# Patient Record
Sex: Male | Born: 1970 | Race: White | Hispanic: No | Marital: Married | Smoking: Current every day smoker
Health system: Southern US, Community
[De-identification: ages and names within clinical notes are randomized; demographics above are authoritative.]

## PROBLEM LIST (undated history)

## (undated) DIAGNOSIS — T8859XA Other complications of anesthesia, initial encounter: Secondary | ICD-10-CM

## (undated) DIAGNOSIS — K219 Gastro-esophageal reflux disease without esophagitis: Secondary | ICD-10-CM

## (undated) DIAGNOSIS — G4730 Sleep apnea, unspecified: Secondary | ICD-10-CM

## (undated) HISTORY — DX: Gastro-esophageal reflux disease without esophagitis: K21.9

## (undated) HISTORY — PX: HAND SURGERY: SHX662

## (undated) HISTORY — PX: PROSTATE SURGERY: SHX751

## (undated) HISTORY — PX: CHOLECYSTECTOMY: SHX55

---

## 2009-11-26 HISTORY — PX: COLONOSCOPY: SHX174

## 2010-03-13 ENCOUNTER — Ambulatory Visit: Payer: Self-pay | Admitting: Gastroenterology

## 2010-03-13 DIAGNOSIS — K219 Gastro-esophageal reflux disease without esophagitis: Secondary | ICD-10-CM

## 2010-03-13 DIAGNOSIS — R1310 Dysphagia, unspecified: Secondary | ICD-10-CM | POA: Insufficient documentation

## 2010-03-13 DIAGNOSIS — K644 Residual hemorrhoidal skin tags: Secondary | ICD-10-CM | POA: Insufficient documentation

## 2010-03-13 DIAGNOSIS — K625 Hemorrhage of anus and rectum: Secondary | ICD-10-CM

## 2010-03-13 DIAGNOSIS — R1013 Epigastric pain: Secondary | ICD-10-CM

## 2010-03-14 ENCOUNTER — Encounter: Payer: Self-pay | Admitting: Gastroenterology

## 2010-03-27 ENCOUNTER — Ambulatory Visit: Payer: Self-pay | Admitting: Gastroenterology

## 2010-03-27 ENCOUNTER — Ambulatory Visit (HOSPITAL_COMMUNITY): Admission: RE | Admit: 2010-03-27 | Discharge: 2010-03-27 | Payer: Self-pay | Admitting: Gastroenterology

## 2010-06-01 ENCOUNTER — Encounter: Payer: Self-pay | Admitting: Gastroenterology

## 2010-12-26 NOTE — Assessment & Plan Note (Signed)
Summary: NPP,GERD,HEMORRHOIDS.GU   Visit Type:  Consult Referring Provider:  Forest Gleason Primary Care Provider:  [facility]  Chief Complaint:  reflux and hemorroid.  History of Present Illness: [name] is a pleasant 40 y/o WM, patient of Dr. Forest Gleason, who presents today for further evaluation of GERD, epigastric tenderness, and hemorrhoids. He has had GERD for several years. States it was diagnosed by ENT and he was started on Zegerid. He has taken Zegerid OTC intermittently but more recently has taken more regularly. He complains of epigastric tenderness more over the last six months. He has hoarseness but believes this is due to strain at time of auctioneering. He has intermittent heartburn. Occasionally has dysphagia to solid foods. Symptoms worse with stress. He takes Zegerid mostly when he has increased belching. All symptoms worse with auctioneering.   He also has chronic soft to loose stools. Usually one BM daily but up to 3-4 if increased stress. See brbpr frequently. Uses [place] Jelly. Thinks he has hemorrhoid, which he wants removed. Never had TCS. Denies melena, constipation.    Current Medications (verified): 1)  Zegerid 40-1100 Mg Caps (Omeprazole-Sodium Bicarbonate) .... As Needed  Allergies (verified): No Known Drug Allergies  Past History:  Past Medical History: GERD H/O chronic sinusitis  Past Surgical History: Left hand fracture  Family History: No FH CRC, liver, chronic GI illnesses.  Social History: Married. 2 children. Farm cows/horses. Auctioneer. 1ppd for 20 years. Drinks 24 ounces of beer in evening about 3-4 days per week. No drugs.  Review of Systems General:  Denies fever, chills, sweats, weakness, and weight loss. Eyes:  Denies vision loss. ENT:  Complains of hoarseness and difficulty swallowing; denies nasal congestion and sore throat. CV:  Denies chest pains, angina, palpitations, dyspnea on exertion, and peripheral  edema. Resp:  Denies dyspnea at rest, dyspnea with exercise, cough, and sputum. GI:  See HPI. GU:  Denies urinary burning and blood in urine. MS:  Denies joint pain / LOM. Derm:  Denies rash and itching. Neuro:  Denies weakness, frequent headaches, memory loss, and confusion. Psych:  Denies depression and anxiety. Endo:  Denies unusual weight change. Heme:  Denies bruising and bleeding. Allergy:  Denies hives and rash.  Vital Signs:  Patient profile:   40 year old male Height:      69 inches Weight:      184 pounds BMI:     27.27 Temp:     97.7 degrees F oral Pulse rate:   80 / minute BP sitting:   112 / 84  (left arm) Cuff size:   regular  Vitals Entered By: Hendricks Limes LPN (March 13, 2010 10:40 AM)  Physical Exam  General:  Well developed, well nourished, no acute distress. Head:  Normocephalic and atraumatic. Eyes:  Conjunctivae pink, no scleral icterus.  Mouth:  Oropharyngeal mucosa moist, pink.  No lesions, erythema or exudate.    Neck:  Supple; no masses or thyromegaly. Lungs:  Clear throughout to auscultation. Heart:  Regular rate and rhythm; no murmurs, rubs,  or bruits. Abdomen:  Normal BS. Tender in epigastrium as well as over the xiphoid process. Liver edge palpable at 4 fingerbredths below RCM in MCL. Smooth edge, nontender. Spleen not palpated. No masses or hernia. No abd bruit. Rectal:  At 6'oclock, pea sized skin tag noted. No erythema. Slightly tenderness DRE. No stool present. No masses in rectal vault. Secretions heme negative. Extremities:  No clubbing, cyanosis, edema or deformities noted. Neurologic:  Alert  and  oriented x4;  grossly normal neurologically. Skin:  Intact without significant lesions or rashes. Cervical Nodes:  No significant cervical adenopathy. Psych:  Alert and cooperative. Normal mood and affect.  Impression & Recommendations:  Problem # 1:  EPIGASTRIC PAIN (ICD-789.06)  Six month h/o intermittent epigastric discomfort associated  with typical reflux symptoms. Suspect secondary to GERD. Zegerid OTC has helped in past. Advised to take one daily. EGD to be performed in near future.  Risks, alternatives, benefits including but not limited to risk of reaction to medications, bleeding, infection, and perforation addressed.  Patient voiced understanding and verbal consent obtained.   He also has tenderness over the xiphoid process, likely due to inflammation. Hold on anti-inflammatories until after EGD. May benefit from two week trial of Advil.  Orders: Consultation Level III (16109)  Problem # 2:  DYSPHAGIA UNSPECIFIED (ICD-787.20)  ?esophageal ring or stricture. Given h/o chronic GERD, chronic tob/etoh use recommend EGD for further evaluation. EGD/ED to be performed in near future.  Risks, alternatives, benefits including but not limited to risk of reaction to medications, bleeding, infection, and perforation addressed.  Patient voiced understanding and verbal consent obtained.   Orders: Consultation Level III (60454)  Problem # 3:  GERD (ICD-530.81)  See #2. Take Zegeric OTC daily. If persistent symptoms, may need to take increased dose.  Orders: Consultation Level III (09811)  Problem # 4:  RECTAL BLEEDING (ICD-569.3)  Rectal bleeding, chronic. Suspect due to benign anorectal source. He has increased frequency of stools with stress, which is likely due to IBS rather than IBD or malignancy. Recommend TCS for further evaluation. Colonoscopy to be performed in near future.  Risks, alternatives, and benefits including but not limited to the risk of reaction to medication, bleeding, infection, and perforation were addressed.  Patient voiced understanding and provided verbal consent.   Orders: Consultation Level III ([id number]) Hemoccult Guaiac-1 spec.(in office) (82270)  Problem # 5:  SKIN TAG, HEMORRHOIDAL, RESIDUAL (ICD-455.9)  Will discuss with Dr. Darrick Penna. May consider banding at time of TCS. Patient aware that this may  or may not be option.  Orders: Consultation Level III (29562) I would like to thank Dr. Forest Gleason for allowing [place] to take part in the care of this nice patient.  Appended Document: NPP,GERD,HEMORRHOIDS.GU Please tell pt. If he has internal hemorhoids causing rectal bleeding we can band them. We don't band skin tags. He will need a Careers adviser.  Put pt in a 1.5 hour slot in antcipation of ETC/banding.  Appended Document: NPP,GERD,HEMORRHOIDS.GU I placed pt in a 4.5 hr slot.

## 2010-12-26 NOTE — Letter (Signed)
Summary: [facility]  [facility]   Imported By: Ave Filter 03/14/2010 14:50:00  _____________________________________________________________________  External Attachment:    Type:   Image     Comment:   External Document

## 2010-12-26 NOTE — Letter (Signed)
Summary: insurance correspondence  insurance correspondence   Imported By: Minna Merritts 06/01/2010 11:06:58  _____________________________________________________________________  External Attachment:    Type:   Image     Comment:   External Document

## 2010-12-26 NOTE — Letter (Signed)
Summary: TCS/EGD ORDER  TCS/EGD ORDER   Imported By: Ave Filter 03/13/2010 11:37:26  _____________________________________________________________________  External Attachment:    Type:   Image     Comment:   External Document

## 2017-08-06 DIAGNOSIS — H93293 Other abnormal auditory perceptions, bilateral: Secondary | ICD-10-CM | POA: Diagnosis not present

## 2017-08-06 DIAGNOSIS — H9313 Tinnitus, bilateral: Secondary | ICD-10-CM | POA: Diagnosis not present

## 2017-08-06 DIAGNOSIS — F17200 Nicotine dependence, unspecified, uncomplicated: Secondary | ICD-10-CM | POA: Diagnosis not present

## 2017-08-06 DIAGNOSIS — J342 Deviated nasal septum: Secondary | ICD-10-CM | POA: Diagnosis not present

## 2018-03-12 DIAGNOSIS — J329 Chronic sinusitis, unspecified: Secondary | ICD-10-CM | POA: Diagnosis not present

## 2018-03-12 DIAGNOSIS — J111 Influenza due to unidentified influenza virus with other respiratory manifestations: Secondary | ICD-10-CM | POA: Diagnosis not present

## 2019-06-05 ENCOUNTER — Emergency Department (HOSPITAL_COMMUNITY): Payer: BC Managed Care – PPO

## 2019-06-05 ENCOUNTER — Encounter (HOSPITAL_COMMUNITY): Payer: Self-pay | Admitting: Emergency Medicine

## 2019-06-05 ENCOUNTER — Emergency Department (HOSPITAL_COMMUNITY)
Admission: EM | Admit: 2019-06-05 | Discharge: 2019-06-05 | Disposition: A | Discharge status: Home / Self Care | Payer: BC Managed Care – PPO | Attending: Emergency Medicine | Admitting: Emergency Medicine

## 2019-06-05 ENCOUNTER — Other Ambulatory Visit: Payer: Self-pay

## 2019-06-05 DIAGNOSIS — S2232XA Fracture of one rib, left side, initial encounter for closed fracture: Secondary | ICD-10-CM

## 2019-06-05 DIAGNOSIS — W1789XA Other fall from one level to another, initial encounter: Secondary | ICD-10-CM | POA: Diagnosis not present

## 2019-06-05 DIAGNOSIS — S2231XA Fracture of one rib, right side, initial encounter for closed fracture: Secondary | ICD-10-CM | POA: Insufficient documentation

## 2019-06-05 DIAGNOSIS — Y929 Unspecified place or not applicable: Secondary | ICD-10-CM | POA: Diagnosis not present

## 2019-06-05 DIAGNOSIS — M546 Pain in thoracic spine: Secondary | ICD-10-CM | POA: Diagnosis not present

## 2019-06-05 DIAGNOSIS — R0789 Other chest pain: Secondary | ICD-10-CM | POA: Diagnosis not present

## 2019-06-05 DIAGNOSIS — Y999 Unspecified external cause status: Secondary | ICD-10-CM | POA: Insufficient documentation

## 2019-06-05 DIAGNOSIS — W19XXXA Unspecified fall, initial encounter: Secondary | ICD-10-CM

## 2019-06-05 DIAGNOSIS — Y9389 Activity, other specified: Secondary | ICD-10-CM | POA: Diagnosis not present

## 2019-06-05 DIAGNOSIS — S299XXA Unspecified injury of thorax, initial encounter: Secondary | ICD-10-CM | POA: Diagnosis not present

## 2019-06-05 DIAGNOSIS — R0602 Shortness of breath: Secondary | ICD-10-CM | POA: Diagnosis not present

## 2019-06-05 MED ORDER — HYDROCODONE-ACETAMINOPHEN 5-325 MG PO TABS: 1.0000 | ORAL_TABLET | ORAL | 0 refills | Status: DC | PRN

## 2019-06-05 MED ORDER — METHOCARBAMOL 500 MG PO TABS: 500.0000 mg | ORAL_TABLET | Freq: Every evening | ORAL | 0 refills | Status: DC | PRN

## 2019-06-05 NOTE — ED Notes (Signed)
Called x-ray to ask reason for delay-they are short staffed, patient updated.

## 2019-06-05 NOTE — Discharge Instructions (Signed)
Take pain medicine as needed for severe pain Use incentive spirometer every 2 hours to prevent pneumonia Please return if worsening

## 2019-06-05 NOTE — ED Provider Notes (Signed)
[facility] Provider Note   CSN: [id number] Arrival date & time: 06/05/19  1023     History   Chief Complaint Chief Complaint  Patient presents with  . Fall    HPI Darryl Turner is a 48 y.o. male who presents with a fall.  No significant past medical history.  Patient states that he was trying to load a truck onto a trailer and had a mechanical fall off of the trailer and fell backwards.  The incident happened 3 days ago.  He hit the back of his head and landed on his back.  He sustained a small laceration over the top of his head but states that this is healing well.  He denies headache, dizziness, nausea, vomiting, vision changes, neck pain.  He states that the wind got knocked out of him and over the past several days he has had gradually worsening pain in his mid back and over the right posterior ribs.  It hurts to take deep breaths and when he moves around.  He has been using a brace but states that the pain is getting worse and therefore he decided to come to the emergency department.  He reports associated muscle spasms of the back.  He denies any lower back pain, hip pain.  He has been ambulatory.  He is not on any blood thinners.     HPI  History reviewed. No pertinent past medical history.  Patient Active Problem List   Diagnosis Date Noted  . SKIN TAG, HEMORRHOIDAL, RESIDUAL 03/13/2010  . GERD 03/13/2010  . RECTAL BLEEDING 03/13/2010  . DYSPHAGIA UNSPECIFIED 03/13/2010  . EPIGASTRIC PAIN 03/13/2010    Past Surgical History:  Procedure Laterality Date  . HAND SURGERY Left    2001  . PROSTATE SURGERY     polyps removed        Home Medications    Prior to Admission medications   Not on File    Family History No family history on file.  Social History Social History   Tobacco Use  . Smoking status: Never Smoker  . Smokeless tobacco: Current User  Substance Use Topics  . Alcohol use: Yes    Comment: socially   . Drug use: Not  Currently     Allergies   Patient has no known allergies.   Review of Systems Review of Systems  Constitutional: Negative for fever.  Respiratory: Positive for shortness of breath.   Cardiovascular: Positive for chest pain.  Gastrointestinal: Negative for abdominal pain.  Musculoskeletal: Positive for back pain and myalgias. Negative for arthralgias.  Skin: Positive for wound.  Neurological: Negative for dizziness, syncope, weakness, numbness and headaches.  All other systems reviewed and are negative.    Physical Exam Updated Vital Signs BP (!) 154/96 (BP Location: Left Arm)   Pulse 61   Temp 98 F (36.7 C) (Oral)   Resp 20   Wt 77.1 kg   SpO2 99%   BMI 25.10 kg/m   Physical Exam Vitals signs and nursing note reviewed.  Constitutional:      General: He is not in acute distress.    Appearance: Normal appearance. He is well-developed. He is not ill-appearing.  HENT:     Head: Normocephalic and atraumatic.     Comments: Small, healed over abrasion over the right parietal area of the scalp Eyes:     General: No scleral icterus.       Right eye: No discharge.  Left eye: No discharge.     Conjunctiva/sclera: Conjunctivae normal.     Pupils: Pupils are equal, round, and reactive to light.  Neck:     Musculoskeletal: Normal range of motion.     Comments: No neck tenderness Cardiovascular:     Rate and Rhythm: Normal rate and regular rhythm.  Pulmonary:     Effort: Pulmonary effort is normal. No respiratory distress.     Breath sounds: Normal breath sounds.  Chest:     Chest wall: Tenderness (tenderness over the right lateral and posterior lower ribs) present.  Abdominal:     General: There is no distension.     Palpations: Abdomen is soft.     Tenderness: There is no abdominal tenderness.  Musculoskeletal:     Comments: Point tenderness over the mid-thoracic back and right paraspinal muscles  Skin:    General: Skin is warm and dry.  Neurological:      Mental Status: He is alert and oriented to person, place, and time.  Psychiatric:        Behavior: Behavior normal.      ED Treatments / Results  Labs (all labs ordered are listed, but only abnormal results are displayed) Labs Reviewed - No data to display  EKG None  Radiology Dg Ribs Unilateral W/chest Right  Result Date: 06/05/2019 CLINICAL DATA:  Pain following recent fall EXAM: RIGHT RIBS AND CHEST - 3+ VIEW COMPARISON:  None. FINDINGS: Frontal chest as well as oblique and cone-down rib images obtained. Lungs are clear. Heart size and pulmonary vascularity are normal. No adenopathy. There is no demonstrable rib fracture. No pneumothorax or pleural effusion. IMPRESSION: No demonstrable rib fracture.  Lungs clear. Electronically Signed   By: Bretta BangWilliam  Woodruff III M.D.   On: 06/05/2019 13:01   Dg Thoracic Spine 2 View  Result Date: 06/05/2019 CLINICAL DATA:  Pain following recent fall EXAM: THORACIC SPINE 3 VIEWS COMPARISON:  None. FINDINGS: Frontal, lateral, and swimmer's views were obtained. There is no fracture or spondylolisthesis. Disc spaces appear unremarkable. No erosive change or paraspinous lesion. Visualized lungs clear. IMPRESSION: No fracture or spondylolisthesis.  No evident arthropathy. Electronically Signed   By: Bretta BangWilliam  Woodruff III M.D.   On: 06/05/2019 13:00   Ct Chest Wo Contrast  Result Date: 06/05/2019 CLINICAL DATA:  Back and right posterior rib pain after falling off the back of a truck and landing on a trailer. EXAM: CT CHEST WITHOUT CONTRAST TECHNIQUE: Multidetector CT imaging of the chest was performed following the standard protocol without IV contrast. COMPARISON:  Chest and right rib radiographs obtained earlier today. FINDINGS: Cardiovascular: Minimal coronary artery calcification. Normal sized heart. Mediastinum/Nodes: No enlarged mediastinal or axillary lymph nodes. Multiple small, normal sized calcified left hilar lymph nodes. Thyroid gland, trachea, and  esophagus demonstrate no significant findings. Lungs/Pleura: Minimal atelectasis and small amount of probable pleural blood at the right lung base posteriorly. Small amount of bullous change in the right middle lobe medially. Clear left lung. Small superior segment left lower lobe calcified granuloma. No pneumothorax. Upper Abdomen: Unremarkable. Musculoskeletal: Essentially nondisplaced right posterior 10th rib fracture. Thoracic spine degenerative changes. IMPRESSION: 1. Essentially nondisplaced right posterior 10th rib fracture. 2. Minimal atelectasis and small amount of probable pleural blood at the right lung base posteriorly. 3. Mild right middle lobe bullous emphysema. 4. Minimal coronary artery atheromatous calcification. Emphysema (ICD10-J43.9). Electronically Signed   By: Beckie SaltsSteven  Reid M.D.   On: 06/05/2019 13:57    Procedures Procedures (including critical care time)  Medications Ordered in ED Medications - No data to display   Initial Impression / Assessment and Plan / ED Course  I have reviewed the triage vital signs and the nursing notes.  Pertinent labs & imaging results that were available during my care of the patient were reviewed by me and considered in my medical decision making (see chart for details).  48 year old male presents with a fall 3 days ago and worsening back and side pain.  He also reports a head injury however states that this is minor and he denies any headache, dizziness, vomiting.  He has no neck pain, anterior chest pain, abdominal pain.  He is markedly tender over the mid back and right posterior lateral ribs.  Will obtain chest x-ray and thoracic x-ray.  He is declining pain medicine currently.  Chest x-ray and back x-ray are negative.  Discussed with the patient and he feels very uncomfortable and is still very tender.  Will obtain CT of the chest  Chest CT is remarkable for a nondisplaced 10th posterior right rib fracture.  Discussed with patient.  He was  given prescription for pain medicine, muscle relaxer and incentive spirometer.  He is encouraged to follow-up with his doctor and return if worsening.  Final Clinical Impressions(s) / ED Diagnoses   Final diagnoses:  Closed fracture of one rib of left side, initial encounter  Fall, initial encounter    ED Discharge Orders    None       Recardo Evangelist, PA-C 06/05/19 [place], [place], DO 06/09/19 1646

## 2019-06-05 NOTE — ED Triage Notes (Signed)
Pt states on Tuesday, he fell off the back of a truck and landed on a trailer on his RT side. Pt c/o back pain and RT sided rib cage pain. Pain worsens with movement and deep inspiration.

## 2019-07-13 DIAGNOSIS — Z0000 Encounter for general adult medical examination without abnormal findings: Secondary | ICD-10-CM | POA: Diagnosis not present

## 2019-07-13 DIAGNOSIS — R1031 Right lower quadrant pain: Secondary | ICD-10-CM | POA: Diagnosis not present

## 2019-07-13 DIAGNOSIS — Z0001 Encounter for general adult medical examination with abnormal findings: Secondary | ICD-10-CM | POA: Diagnosis not present

## 2019-07-13 DIAGNOSIS — R1011 Right upper quadrant pain: Secondary | ICD-10-CM | POA: Diagnosis not present

## 2019-07-13 DIAGNOSIS — Z1389 Encounter for screening for other disorder: Secondary | ICD-10-CM | POA: Diagnosis not present

## 2019-07-13 DIAGNOSIS — Z6826 Body mass index (BMI) 26.0-26.9, adult: Secondary | ICD-10-CM | POA: Diagnosis not present

## 2020-12-04 DIAGNOSIS — J019 Acute sinusitis, unspecified: Secondary | ICD-10-CM | POA: Diagnosis not present

## 2020-12-04 DIAGNOSIS — R059 Cough, unspecified: Secondary | ICD-10-CM | POA: Diagnosis not present

## 2021-07-09 IMAGING — DX RIGHT RIBS AND CHEST - 3+ VIEW
4 series · 4 of 4 positions shown · non-contrast
Comparison: None.

CLINICAL DATA: Pain following recent fall

EXAM:
RIGHT RIBS AND CHEST - 3+ VIEW

[chest pa]
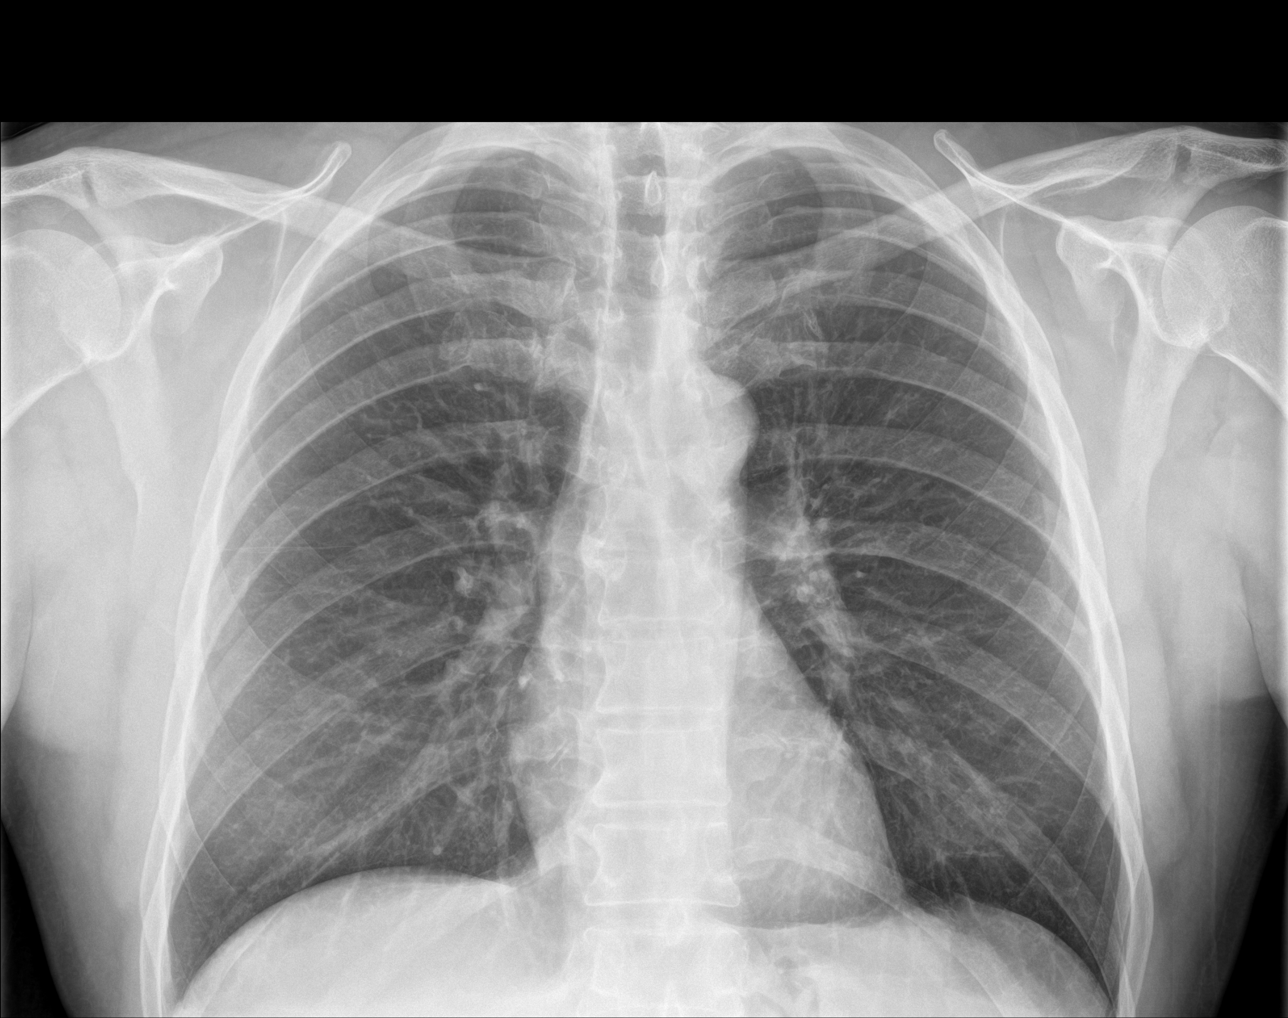

[rib pa]
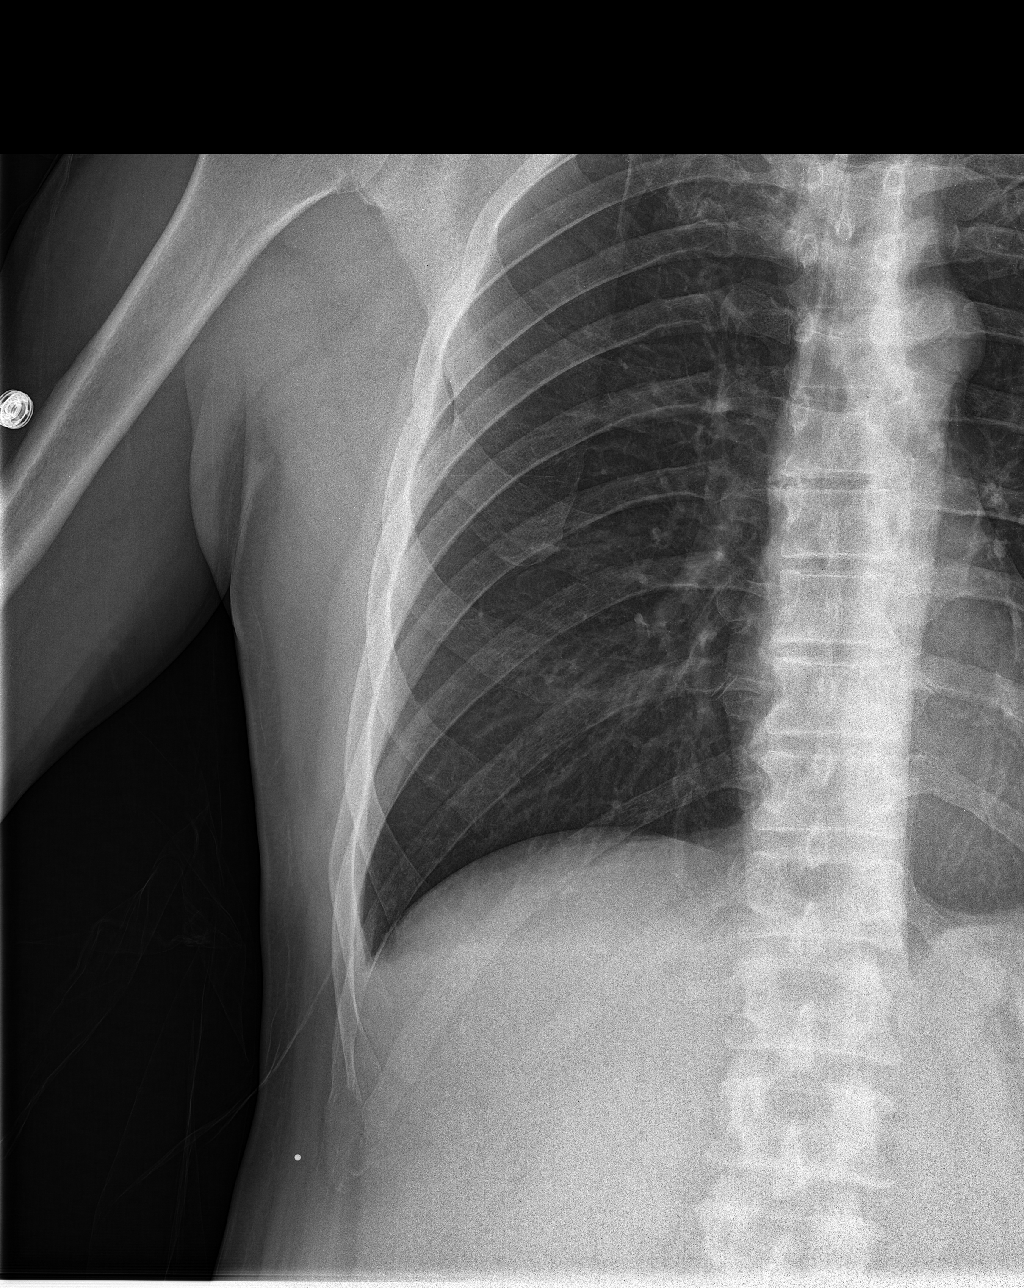

[rib obl (1 of 2)]
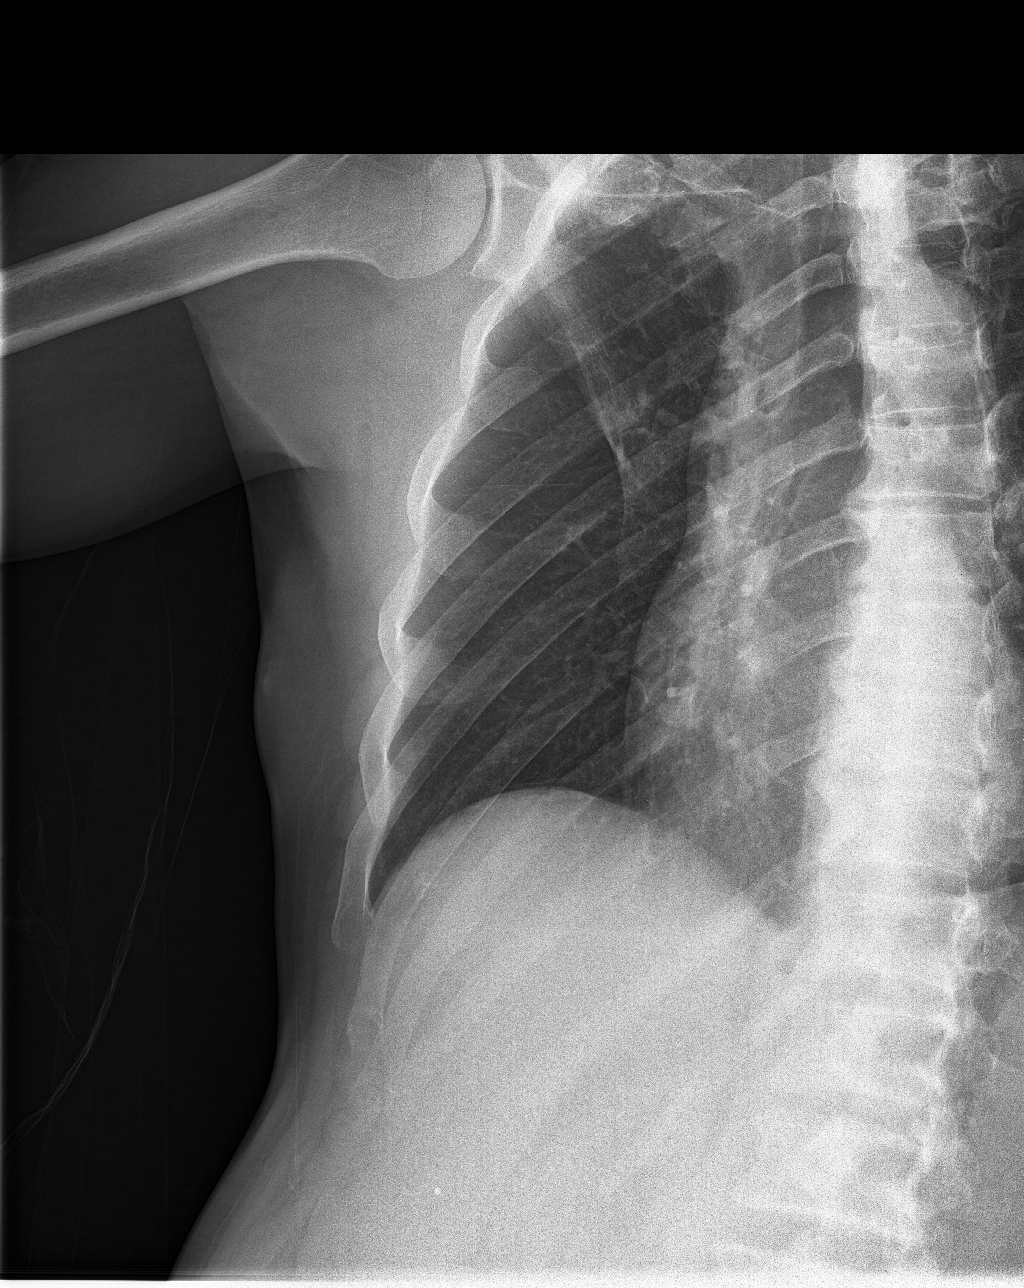

[rib obl (2 of 2)]
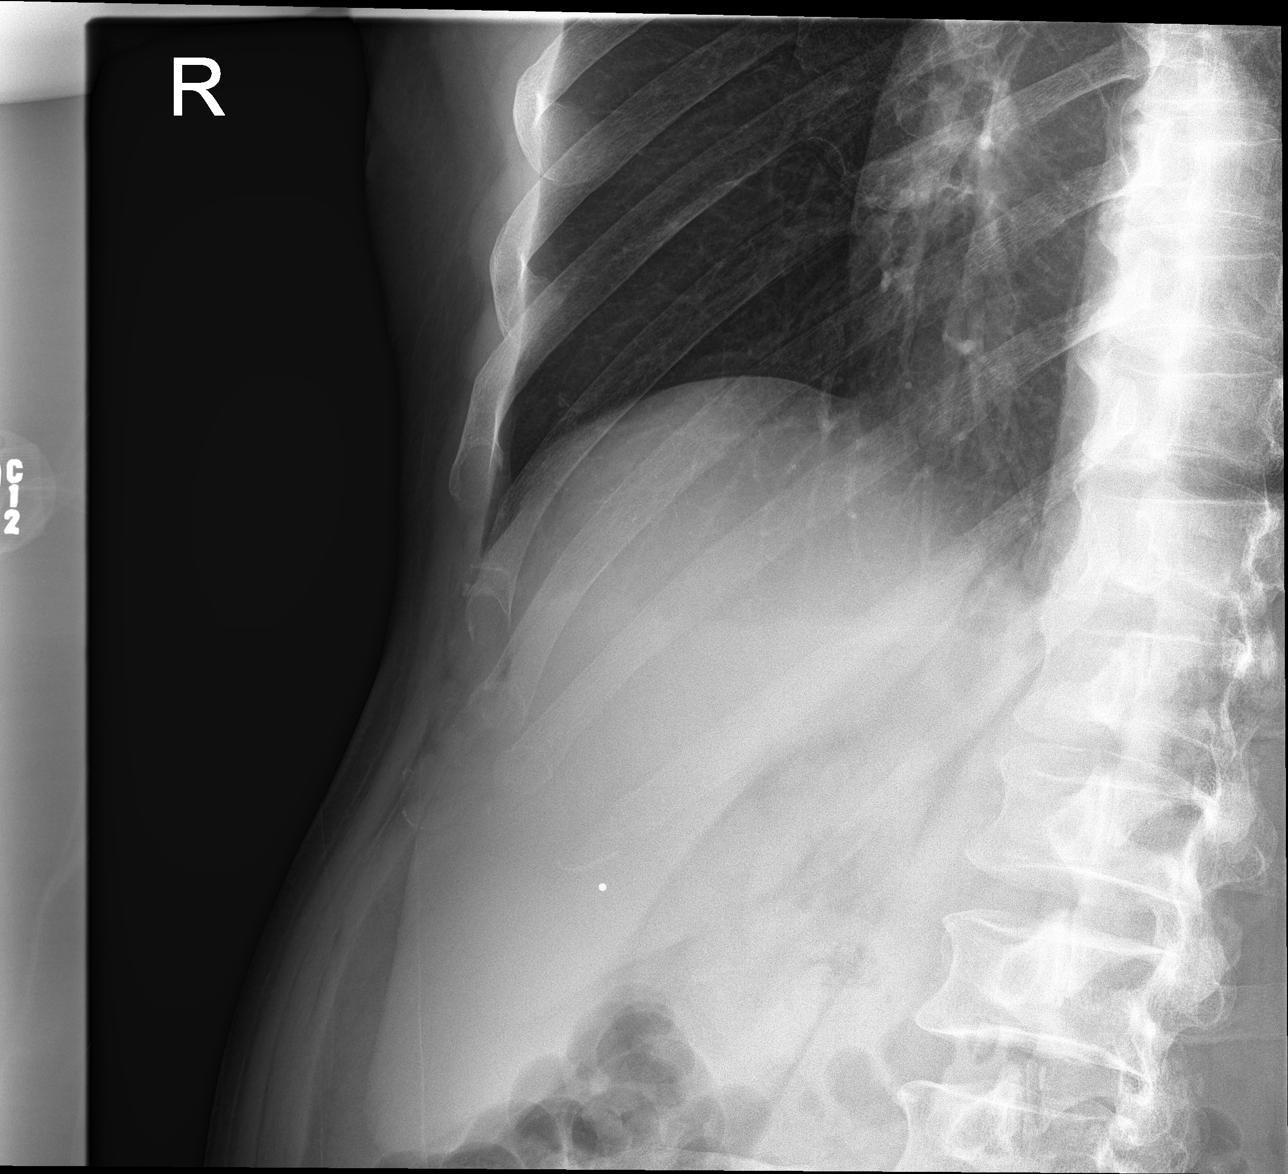

[4 of 4 positions shown; findings below may reference images not displayed]

FINDINGS: Frontal chest as well as oblique and cone-down rib images obtained.
Lungs are clear. Heart size and pulmonary vascularity are normal. No
adenopathy. There is no demonstrable rib fracture. No pneumothorax
or pleural effusion.
IMPRESSION: No demonstrable rib fracture.  Lungs clear.

## 2022-06-29 ENCOUNTER — Other Ambulatory Visit: Payer: Self-pay | Admitting: Family Medicine

## 2022-06-29 ENCOUNTER — Other Ambulatory Visit (HOSPITAL_COMMUNITY): Payer: Self-pay | Admitting: Family Medicine

## 2022-06-29 DIAGNOSIS — R1011 Right upper quadrant pain: Secondary | ICD-10-CM

## 2022-06-29 DIAGNOSIS — Z6828 Body mass index (BMI) 28.0-28.9, adult: Secondary | ICD-10-CM | POA: Diagnosis not present

## 2022-06-29 DIAGNOSIS — E663 Overweight: Secondary | ICD-10-CM | POA: Diagnosis not present

## 2022-06-29 DIAGNOSIS — Z1331 Encounter for screening for depression: Secondary | ICD-10-CM | POA: Diagnosis not present

## 2022-06-29 DIAGNOSIS — Z0000 Encounter for general adult medical examination without abnormal findings: Secondary | ICD-10-CM | POA: Diagnosis not present

## 2022-06-29 DIAGNOSIS — E785 Hyperlipidemia, unspecified: Secondary | ICD-10-CM | POA: Diagnosis not present

## 2022-07-03 ENCOUNTER — Encounter: Payer: Self-pay | Admitting: *Deleted

## 2022-07-13 ENCOUNTER — Ambulatory Visit (HOSPITAL_COMMUNITY)
Admission: RE | Admit: 2022-07-13 | Discharge: 2022-07-13 | Disposition: A | Discharge status: Home / Self Care | Payer: BC Managed Care – PPO | Source: Ambulatory Visit | Attending: Family Medicine | Admitting: Family Medicine

## 2022-07-13 DIAGNOSIS — R1011 Right upper quadrant pain: Secondary | ICD-10-CM | POA: Insufficient documentation

## 2022-07-13 DIAGNOSIS — K760 Fatty (change of) liver, not elsewhere classified: Secondary | ICD-10-CM | POA: Diagnosis not present

## 2022-08-01 ENCOUNTER — Ambulatory Visit (INDEPENDENT_AMBULATORY_CARE_PROVIDER_SITE_OTHER): Payer: BC Managed Care – PPO | Admitting: Internal Medicine

## 2022-08-01 ENCOUNTER — Encounter: Payer: Self-pay | Admitting: Internal Medicine

## 2022-08-01 VITALS — BP 129/84 | HR 71 | Temp 98.2°F | Ht 69.0 in | Wt 194.6 lb

## 2022-08-01 DIAGNOSIS — Z1211 Encounter for screening for malignant neoplasm of colon: Secondary | ICD-10-CM | POA: Diagnosis not present

## 2022-08-01 DIAGNOSIS — R194 Change in bowel habit: Secondary | ICD-10-CM

## 2022-08-01 DIAGNOSIS — R1011 Right upper quadrant pain: Secondary | ICD-10-CM | POA: Diagnosis not present

## 2022-08-01 DIAGNOSIS — K219 Gastro-esophageal reflux disease without esophagitis: Secondary | ICD-10-CM

## 2022-08-01 NOTE — H&P (View-Only) (Signed)
Primary Care Physician:  Assunta Found, MD Primary Gastroenterologist:  Dr. Marletta Lor  Chief Complaint  Patient presents with   Abdominal Pain    Abdominal pain on right side that comes and goes. Had abdominal US 07/13/22. Having ongoing loose stools.     HPI:   Darryl Turner is a 51 y.o. male who presents to clinic today by referral from his PCP Dr. Phillips Odor  with multiple GI complaints.  States for many years she has had off-and-on right upper quadrant abdominal pain.  Over the last 3 to 4 months this is progressively worsening.  Remains intermittent in nature though very severe at times.  Will "doubled him over."  Nothing seems to help.  Sometimes radiates to his back.  Some associated nausea.  Not particularly related to meals.  Does note some intermittent loose stools as well.  Occasional normal bowel movements.  States his stools are dark at times.  No frank hematochezia  Underwent ultrasound 07/13/2022 which showed a distended gallbladder as well as positive Murphy sign.  Recommended HIDA scan.  Also noted hepatic steatosis.  Patient does have chronic acid reflux which is well controlled on omeprazole 40 mg.  No dysphagia odynophagia.    Due for screening colonoscopy.  Last colonoscopy 2011 with few benign polyps removed.  No family history of colorectal malignancy.   Past Medical History:  Diagnosis Date   GERD (gastroesophageal reflux disease)     Past Surgical History:  Procedure Laterality Date   COLONOSCOPY  2011   polpys removed per patient   HAND SURGERY Left    2001    Current Outpatient Medications  Medication Sig Dispense Refill   cetirizine (ZYRTEC) 10 MG tablet Take 10 mg by mouth daily.     omeprazole-sodium bicarbonate (ZEGERID) 40-1100 MG capsule Take 1 capsule by mouth daily before breakfast.     Probiotic CAPS Take 1 capsule by mouth daily.     No current facility-administered medications for this visit.    Allergies as of 08/01/2022   (No Known  Allergies)    No family history on file.  Social History   Socioeconomic History   Marital status: Married    Spouse name: Not on file   Number of children: Not on file   Years of education: Not on file   Highest education level: Not on file  Occupational History   Not on file  Tobacco Use   Smoking status: Every Day    Types: E-cigarettes    Passive exposure: Current   Smokeless tobacco: Current  Vaping Use   Vaping Use: Some days   Substances: Nicotine  Substance and Sexual Activity   Alcohol use: Yes    Comment: socially    Drug use: Not Currently   Sexual activity: Not on file  Other Topics Concern   Not on file  Social History Narrative   Not on file   Social Determinants of Health   Financial Resource Strain: Not on file  Food Insecurity: Not on file  Transportation Needs: Not on file  Physical Activity: Not on file  Stress: Not on file  Social Connections: Not on file  Intimate Partner Violence: Not on file    Subjective: Review of Systems  Constitutional:  Negative for chills and fever.  HENT:  Negative for congestion and hearing loss.   Eyes:  Negative for blurred vision and double vision.  Respiratory:  Negative for cough and shortness of breath.   Cardiovascular:  Negative for chest  pain and palpitations.  Gastrointestinal:  Positive for abdominal pain. Negative for blood in stool, constipation, diarrhea, heartburn, melena and vomiting.  Genitourinary:  Negative for dysuria and urgency.  Musculoskeletal:  Negative for joint pain and myalgias.  Skin:  Negative for itching and rash.  Neurological:  Negative for dizziness and headaches.  Psychiatric/Behavioral:  Negative for depression. The patient is not nervous/anxious.        Objective: BP 129/84 (BP Location: Left Arm, Patient Position: Sitting, Cuff Size: Large)   Pulse 71   Temp 98.2 F (36.8 C) (Oral)   Ht 5\' 9"  (1.753 m)   Wt 194 lb 9.6 oz (88.3 kg)   BMI 28.74 kg/m  Physical  Exam Constitutional:      Appearance: Normal appearance.  HENT:     Head: Normocephalic and atraumatic.  Eyes:     Extraocular Movements: Extraocular movements intact.     Conjunctiva/sclera: Conjunctivae normal.  Cardiovascular:     Rate and Rhythm: Normal rate and regular rhythm.  Pulmonary:     Effort: Pulmonary effort is normal.     Breath sounds: Normal breath sounds.  Abdominal:     General: Bowel sounds are normal.     Palpations: Abdomen is soft.  Musculoskeletal:        General: Normal range of motion.     Cervical back: Normal range of motion and neck supple.  Skin:    General: Skin is warm.  Neurological:     General: No focal deficit present.     Mental Status: He is alert and oriented to person, place, and time.  Psychiatric:        Mood and Affect: Mood normal.        Behavior: Behavior normal.      Assessment: *Right upper quadrant pain-progressively worsening, intermittent, severe *Chronic GERD-well-controlled omeprazole *Colon cancer screening  Plan: We will order HIDA scan today to further evaluate right upper quadrant abdominal pain.  Will schedule for screening colonoscopy.The risks including infection, bleed, or perforation as well as benefits, limitations, alternatives and imponderables have been reviewed with the patient. Questions have been answered. All parties agreeable.  GERD well-controlled on omeprazole.  We will continue.  Thank you Dr. for the kind referral.  08/01/2022 1:59 PM   Disclaimer: This note was dictated with voice recognition software. Similar sounding words can inadvertently be transcribed and may not be corrected upon review.

## 2022-08-01 NOTE — Progress Notes (Signed)
  Primary Care Physician:  Golding, John, MD Primary Gastroenterologist:  Dr. Laquinton Bihm  Chief Complaint  Patient presents with   Abdominal Pain    Abdominal pain on right side that comes and goes. Had abdominal us 07/13/22. Having ongoing loose stools.     HPI:   Darryl Turner is a 51 y.o. male who presents to clinic today by referral from his PCP Dr. Golding  with multiple GI complaints.  States for many years she has had off-and-on right upper quadrant abdominal pain.  Over the last 3 to 4 months this is progressively worsening.  Remains intermittent in nature though very severe at times.  Will "doubled him over."  Nothing seems to help.  Sometimes radiates to his back.  Some associated nausea.  Not particularly related to meals.  Does note some intermittent loose stools as well.  Occasional normal bowel movements.  States his stools are dark at times.  No frank hematochezia  Underwent ultrasound 07/13/2022 which showed a distended gallbladder as well as positive Murphy sign.  Recommended HIDA scan.  Also noted hepatic steatosis.  Patient does have chronic acid reflux which is well controlled on omeprazole 40 mg.  No dysphagia odynophagia.    Due for screening colonoscopy.  Last colonoscopy 2011 with few benign polyps removed.  No family history of colorectal malignancy.   Past Medical History:  Diagnosis Date   GERD (gastroesophageal reflux disease)     Past Surgical History:  Procedure Laterality Date   COLONOSCOPY  2011   polpys removed per patient   HAND SURGERY Left    2001    Current Outpatient Medications  Medication Sig Dispense Refill   cetirizine (ZYRTEC) 10 MG tablet Take 10 mg by mouth daily.     omeprazole-sodium bicarbonate (ZEGERID) 40-1100 MG capsule Take 1 capsule by mouth daily before breakfast.     Probiotic CAPS Take 1 capsule by mouth daily.     No current facility-administered medications for this visit.    Allergies as of 08/01/2022   (No Known  Allergies)    No family history on file.  Social History   Socioeconomic History   Marital status: Married    Spouse name: Not on file   Number of children: Not on file   Years of education: Not on file   Highest education level: Not on file  Occupational History   Not on file  Tobacco Use   Smoking status: Every Day    Types: E-cigarettes    Passive exposure: Current   Smokeless tobacco: Current  Vaping Use   Vaping Use: Some days   Substances: Nicotine  Substance and Sexual Activity   Alcohol use: Yes    Comment: socially    Drug use: Not Currently   Sexual activity: Not on file  Other Topics Concern   Not on file  Social History Narrative   Not on file   Social Determinants of Health   Financial Resource Strain: Not on file  Food Insecurity: Not on file  Transportation Needs: Not on file  Physical Activity: Not on file  Stress: Not on file  Social Connections: Not on file  Intimate Partner Violence: Not on file    Subjective: Review of Systems  Constitutional:  Negative for chills and fever.  HENT:  Negative for congestion and hearing loss.   Eyes:  Negative for blurred vision and double vision.  Respiratory:  Negative for cough and shortness of breath.   Cardiovascular:  Negative for chest   pain and palpitations.  Gastrointestinal:  Positive for abdominal pain. Negative for blood in stool, constipation, diarrhea, heartburn, melena and vomiting.  Genitourinary:  Negative for dysuria and urgency.  Musculoskeletal:  Negative for joint pain and myalgias.  Skin:  Negative for itching and rash.  Neurological:  Negative for dizziness and headaches.  Psychiatric/Behavioral:  Negative for depression. The patient is not nervous/anxious.        Objective: BP 129/84 (BP Location: Left Arm, Patient Position: Sitting, Cuff Size: Large)   Pulse 71   Temp 98.2 F (36.8 C) (Oral)   Ht 5\' 9"  (1.753 m)   Wt 194 lb 9.6 oz (88.3 kg)   BMI 28.74 kg/m  Physical  Exam Constitutional:      Appearance: Normal appearance.  HENT:     Head: Normocephalic and atraumatic.  Eyes:     Extraocular Movements: Extraocular movements intact.     Conjunctiva/sclera: Conjunctivae normal.  Cardiovascular:     Rate and Rhythm: Normal rate and regular rhythm.  Pulmonary:     Effort: Pulmonary effort is normal.     Breath sounds: Normal breath sounds.  Abdominal:     General: Bowel sounds are normal.     Palpations: Abdomen is soft.  Musculoskeletal:        General: Normal range of motion.     Cervical back: Normal range of motion and neck supple.  Skin:    General: Skin is warm.  Neurological:     General: No focal deficit present.     Mental Status: He is alert and oriented to person, place, and time.  Psychiatric:        Mood and Affect: Mood normal.        Behavior: Behavior normal.      Assessment: *Right upper quadrant pain-progressively worsening, intermittent, severe *Chronic GERD-well-controlled omeprazole *Colon cancer screening  Plan: We will order HIDA scan today to further evaluate right upper quadrant abdominal pain.  Will schedule for screening colonoscopy.The risks including infection, bleed, or perforation as well as benefits, limitations, alternatives and imponderables have been reviewed with the patient. Questions have been answered. All parties agreeable.  GERD well-controlled on omeprazole.  We will continue.  Thank you Dr. for the kind referral.  08/01/2022 1:59 PM   Disclaimer: This note was dictated with voice recognition software. Similar sounding words can inadvertently be transcribed and may not be corrected upon review.

## 2022-08-01 NOTE — Patient Instructions (Signed)
For your chronic abdominal pain, I am going to order a HIDA scan to further evaluate your gallbladder.  We will call with these results.  In the meantime, we will schedule you for colonoscopy for colon cancer screening purposes as well as evaluate your chronically loose stools.  Continue on omeprazole for your chronic reflux.  Further recommendations to follow.  It was nice meeting you today.  Dr. Marletta Lor

## 2022-08-02 ENCOUNTER — Telehealth: Payer: Self-pay | Admitting: *Deleted

## 2022-08-02 ENCOUNTER — Other Ambulatory Visit: Payer: Self-pay | Admitting: *Deleted

## 2022-08-02 DIAGNOSIS — R1011 Right upper quadrant pain: Secondary | ICD-10-CM

## 2022-08-02 MED ORDER — PEG 3350-KCL-NA BICARB-NACL 420 G PO SOLR: 4000.0000 mL | Freq: Once | ORAL | 0 refills | Status: AC

## 2022-08-02 NOTE — Telephone Encounter (Signed)
Pt returned call. He is aware of HIDA scan appt details. Scheduled for TCS 9/21 at 12:15pm. Aware will mail instructions. Rx for prep sent to pharmacy.

## 2022-08-02 NOTE — Telephone Encounter (Signed)
PA for HIDA approved via carelon.  Order ID: [id number]      Approval Valid Through: 08/02/2022 - 08/31/2022  Called pt, LMOVM to call back to give HIDA scan appt and to schedule TCS with Dr. Marletta Lor ASA 2

## 2022-08-07 ENCOUNTER — Encounter (HOSPITAL_COMMUNITY)
Admission: RE | Admit: 2022-08-07 | Discharge: 2022-08-07 | Disposition: A | Discharge status: Home / Self Care | Payer: BC Managed Care – PPO | Source: Ambulatory Visit | Attending: Internal Medicine | Admitting: Internal Medicine

## 2022-08-07 ENCOUNTER — Encounter (HOSPITAL_COMMUNITY): Payer: Self-pay

## 2022-08-07 DIAGNOSIS — R1011 Right upper quadrant pain: Secondary | ICD-10-CM | POA: Diagnosis not present

## 2022-08-07 MED ORDER — TECHNETIUM TC 99M MEBROFENIN IV KIT
5.0000 | PACK | Freq: Once | INTRAVENOUS | Status: AC | PRN
  Administered 2022-08-07: 5.4 via INTRAVENOUS

## 2022-08-13 ENCOUNTER — Encounter (HOSPITAL_COMMUNITY): Payer: Self-pay

## 2022-08-13 ENCOUNTER — Other Ambulatory Visit: Payer: Self-pay

## 2022-08-13 ENCOUNTER — Encounter (HOSPITAL_COMMUNITY)
Admission: RE | Admit: 2022-08-13 | Discharge: 2022-08-13 | Disposition: A | Discharge status: Home / Self Care | Payer: BC Managed Care – PPO | Source: Ambulatory Visit | Attending: Internal Medicine | Admitting: Internal Medicine

## 2022-08-16 ENCOUNTER — Encounter (HOSPITAL_COMMUNITY): Payer: Self-pay

## 2022-08-16 ENCOUNTER — Ambulatory Visit (HOSPITAL_COMMUNITY)
Admission: RE | Admit: 2022-08-16 | Discharge: 2022-08-16 | Disposition: A | Discharge status: Home / Self Care | Payer: BC Managed Care – PPO | Attending: Internal Medicine | Admitting: Internal Medicine

## 2022-08-16 ENCOUNTER — Encounter (HOSPITAL_COMMUNITY)
Admission: RE | Disposition: A | Discharge status: Home / Self Care | Payer: Self-pay | Source: Home / Self Care | Attending: Internal Medicine

## 2022-08-16 ENCOUNTER — Ambulatory Visit (HOSPITAL_COMMUNITY): Payer: BC Managed Care – PPO | Admitting: Anesthesiology

## 2022-08-16 DIAGNOSIS — Z1211 Encounter for screening for malignant neoplasm of colon: Secondary | ICD-10-CM | POA: Insufficient documentation

## 2022-08-16 DIAGNOSIS — Z86010 Personal history of colonic polyps: Secondary | ICD-10-CM | POA: Diagnosis not present

## 2022-08-16 DIAGNOSIS — K635 Polyp of colon: Secondary | ICD-10-CM | POA: Diagnosis not present

## 2022-08-16 DIAGNOSIS — K648 Other hemorrhoids: Secondary | ICD-10-CM | POA: Insufficient documentation

## 2022-08-16 DIAGNOSIS — Z79899 Other long term (current) drug therapy: Secondary | ICD-10-CM | POA: Insufficient documentation

## 2022-08-16 DIAGNOSIS — K621 Rectal polyp: Secondary | ICD-10-CM | POA: Insufficient documentation

## 2022-08-16 DIAGNOSIS — Z1212 Encounter for screening for malignant neoplasm of rectum: Secondary | ICD-10-CM | POA: Diagnosis not present

## 2022-08-16 DIAGNOSIS — K219 Gastro-esophageal reflux disease without esophagitis: Secondary | ICD-10-CM | POA: Insufficient documentation

## 2022-08-16 DIAGNOSIS — F17290 Nicotine dependence, other tobacco product, uncomplicated: Secondary | ICD-10-CM | POA: Insufficient documentation

## 2022-08-16 HISTORY — PX: POLYPECTOMY: SHX5525

## 2022-08-16 HISTORY — PX: COLONOSCOPY WITH PROPOFOL: SHX5780

## 2022-08-16 SURGERY — COLONOSCOPY WITH PROPOFOL
Anesthesia: General

## 2022-08-16 MED ORDER — LACTATED RINGERS IV SOLN: INTRAVENOUS | Status: DC

## 2022-08-16 MED ORDER — PROPOFOL 10 MG/ML IV BOLUS
INTRAVENOUS | Status: DC | PRN
  Administered 2022-08-16: 30 mg via INTRAVENOUS
  Administered 2022-08-16: 50 mg via INTRAVENOUS
  Administered 2022-08-16: 20 mg via INTRAVENOUS
  Administered 2022-08-16: 100 mg via INTRAVENOUS

## 2022-08-16 NOTE — Anesthesia Preprocedure Evaluation (Signed)
Anesthesia Evaluation  °Patient identified by MRN, date of birth, ID band °Patient awake ° ° ° °Reviewed: °Allergy & Precautions, H&P , NPO status , Patient's Chart, lab work & pertinent test results, reviewed documented beta blocker date and time  ° °Airway °Mallampati: II ° °TM Distance: >3 FB °Neck ROM: full ° ° ° Dental °no notable dental hx. ° °  °Pulmonary °neg pulmonary ROS, Current Smoker,  °  °Pulmonary exam normal °breath sounds clear to auscultation ° ° ° ° ° ° Cardiovascular °Exercise Tolerance: Good °negative cardio ROS ° ° °Rhythm:regular Rate:Normal ° ° °  °Neuro/Psych °negative neurological ROS ° negative psych ROS  ° GI/Hepatic °Neg liver ROS, GERD  Medicated,  °Endo/Other  °negative endocrine ROS ° Renal/GU °negative Renal ROS  °negative genitourinary °  °Musculoskeletal ° ° Abdominal °  °Peds ° Hematology °negative hematology ROS °(+)   °Anesthesia Other Findings ° ° Reproductive/Obstetrics °negative OB ROS ° °  ° ° ° ° ° ° ° ° ° ° ° ° ° °  °  ° ° ° ° ° ° ° ° °Anesthesia Physical °Anesthesia Plan ° °ASA: 2 ° °Anesthesia Plan: General  ° °Post-op Pain Management:   ° °Induction:  ° °PONV Risk Score and Plan: Propofol infusion ° °Airway Management Planned:  ° °Additional Equipment:  ° °Intra-op Plan:  ° °Post-operative Plan:  ° °Informed Consent: I have reviewed the patients History and Physical, chart, labs and discussed the procedure including the risks, benefits and alternatives for the proposed anesthesia with the patient or authorized representative who has indicated his/her understanding and acceptance.  ° ° ° °Dental Advisory Given ° °Plan Discussed with: CRNA ° °Anesthesia Plan Comments:   ° ° ° ° ° ° °Anesthesia Quick Evaluation ° °

## 2022-08-16 NOTE — Op Note (Signed)
[facility] Patient Name: Darryl Turner Procedure Date: 08/16/2022 11:16 AM MRN: [id number] Date of Birth: 08-24-1971 Attending MD: Elon Alas. Abbey Chatters DO CSN: [id number] Age: 51 Admit Type: Outpatient Procedure:                Colonoscopy Indications:              Screening for colorectal malignant neoplasm Providers:                Elon Alas. Abbey Chatters, DO, Janeece Riggers, RN, Gwynneth Albright RN, RN Referring MD:             Elon Alas. Abbey Chatters, DO Medicines:                See the Anesthesia note for documentation of the                            administered medications Complications:            No immediate complications. Estimated Blood Loss:     Estimated blood loss was minimal. Procedure:                Pre-Anesthesia Assessment:                           - The anesthesia plan was to use monitored                            anesthesia care (MAC).                           After obtaining informed consent, the colonoscope                            was passed under direct vision. Throughout the                            procedure, the patient's blood pressure, pulse, and                            oxygen saturations were monitored continuously. The                            [id number]-HQ190L ([id number]) scope was introduced through                            the anus and advanced to the the cecum, identified                            by appendiceal orifice and ileocecal valve. The                            colonoscopy was performed without difficulty. The                            patient  tolerated the procedure well. The quality                            of the bowel preparation was evaluated using the                            BBPS [facility] Bowel Preparation Scale) with scores                            of: Right Colon = 3, Transverse Colon = 3 and Left                            Colon = 3 (entire mucosa seen well with no residual                             staining, small fragments of stool or opaque                            liquid). The total BBPS score equals 9. Scope In: 11:23:17 AM Scope Out: 11:37:02 AM Scope Withdrawal Time: 0 hours 10 minutes 28 seconds  Total Procedure Duration: 0 hours 13 minutes 45 seconds  Findings:      The perianal and digital rectal examinations were normal.      Non-bleeding internal hemorrhoids were found during endoscopy.      Two sessile polyps were found in the rectum and descending colon. The       polyps were 4 to 5 mm in size. These polyps were removed with a cold       snare. Resection and retrieval were complete.      The exam was otherwise without abnormality. Impression:               - Non-bleeding internal hemorrhoids.                           - Two 4 to 5 mm polyps in the rectum and in the                            descending colon, removed with a cold snare.                            Resected and retrieved.                           - The examination was otherwise normal. Moderate Sedation:      Per Anesthesia Care Recommendation:           - Patient has a contact number available for                            emergencies. The signs and symptoms of potential                            delayed complications were discussed with the  patient. Return to normal activities tomorrow.                            Written discharge instructions were provided to the                            patient.                           - Resume previous diet.                           - Continue present medications.                           - Await pathology results.                           - Repeat colonoscopy in 10 years for screening                            purposes.                           - Return to GI clinic in 3 months. Procedure Code(s):        --- Professional ---                           [phone], Colonoscopy, flexible; with removal of                             tumor(s), polyp(s), or other lesion(s) by snare                            technique Diagnosis Code(s):        --- Professional ---                           Z12.11, Encounter for screening for malignant                            neoplasm of colon                           K62.1, Rectal polyp                           K63.5, Polyp of colon                           K64.8, Other hemorrhoids CPT copyright 2019 American Medical Association. All rights reserved. The codes documented in this report are preliminary and upon coder review may  be revised to meet current compliance requirements. Elon Alas. Abbey Chatters, DO [place] Abbey Chatters, DO 08/16/2022 11:38:41 AM This report has been signed electronically. Number of Addenda: 0

## 2022-08-16 NOTE — Discharge Instructions (Addendum)
  Colonoscopy Discharge Instructions  Read the instructions outlined below and refer to this sheet in the next few weeks. These discharge instructions provide you with general information on caring for yourself after you leave the hospital. Your doctor may also give you specific instructions. While your treatment has been planned according to the most current medical practices available, unavoidable complications occasionally occur.   ACTIVITY You may resume your regular activity, but move at a slower pace for the next 24 hours.  Take frequent rest periods for the next 24 hours.  Walking will help get rid of the air and reduce the bloated feeling in your belly (abdomen).  No driving for 24 hours (because of the medicine (anesthesia) used during the test).   Do not sign any important legal documents or operate any machinery for 24 hours (because of the anesthesia used during the test).  NUTRITION Drink plenty of fluids.  You may resume your normal diet as instructed by your doctor.  Begin with a light meal and progress to your normal diet. Heavy or fried foods are harder to digest and may make you feel sick to your stomach (nauseated).  Avoid alcoholic beverages for 24 hours or as instructed.  MEDICATIONS You may resume your normal medications unless your doctor tells you otherwise.  WHAT YOU CAN EXPECT TODAY Some feelings of bloating in the abdomen.  Passage of more gas than usual.  Spotting of blood in your stool or on the toilet paper.  IF YOU HAD POLYPS REMOVED DURING THE COLONOSCOPY: No aspirin products for 7 days or as instructed.  No alcohol for 7 days or as instructed.  Eat a soft diet for the next 24 hours.  FINDING OUT THE RESULTS OF YOUR TEST Not all test results are available during your visit. If your test results are not back during the visit, make an appointment with your caregiver to find out the results. Do not assume everything is normal if you have not heard from your  caregiver or the medical facility. It is important for you to follow up on all of your test results.  SEEK IMMEDIATE MEDICAL ATTENTION IF: You have more than a spotting of blood in your stool.  Your belly is swollen (abdominal distention).  You are nauseated or vomiting.  You have a temperature over 101.  You have abdominal pain or discomfort that is severe or gets worse throughout the day.   Your colonoscopy revealed 2 polyp(s) which I removed successfully. These are likely benign, hyperplastic. Await pathology results, my office will contact you. I recommend repeating colonoscopy in 10 years for screening purposes.   I am going to order CT scan of your abdomen and pelvis to further evaluate your pain   I hope you have a great rest of your week!  Elon Alas. Abbey Chatters, D.O. Gastroenterology and Hepatology [facility]

## 2022-08-16 NOTE — Transfer of Care (Signed)
Immediate Anesthesia Transfer of Care Note  Patient: Darryl Turner  Procedure(s) Performed: COLONOSCOPY WITH PROPOFOL POLYPECTOMY  Patient Location: Short Stay  Anesthesia Type:General  Level of Consciousness: awake, alert  and oriented  Airway & Oxygen Therapy: Patient Spontanous Breathing  Post-op Assessment: Report given to RN and Post -op Vital signs reviewed and stable  Post vital signs: Reviewed and stable  Last Vitals:  Vitals Value Taken Time  BP 102/65   Temp 36   Pulse 67   Resp 16   SpO2 96     Last Pain:  Vitals:   08/16/22 1122  PainSc: 5          Complications: No notable events documented.

## 2022-08-16 NOTE — Interval H&P Note (Signed)
History and Physical Interval Note:  08/16/2022 11:08 AM  Darryl Turner  has presented today for surgery, with the diagnosis of colon cancer screening.  The various methods of treatment have been discussed with the patient and family. After consideration of risks, benefits and other options for treatment, the patient has consented to  Procedure(s) with comments: COLONOSCOPY WITH PROPOFOL (N/A) - 12:15pm, asa 2, pt knows to arrive at 9:00 as a surgical intervention.  The patient's history has been reviewed, patient examined, no change in status, stable for surgery.  I have reviewed the patient's chart and labs.  Questions were answered to the patient's satisfaction.     [name]

## 2022-08-17 LAB — SURGICAL PATHOLOGY

## 2022-08-17 NOTE — Anesthesia Postprocedure Evaluation (Signed)
Anesthesia Post Note  Patient: Building control surveyor  Procedure(s) Performed: COLONOSCOPY WITH PROPOFOL POLYPECTOMY  Patient location during evaluation: Phase II Anesthesia Type: General Level of consciousness: awake Pain management: pain level controlled Vital Signs Assessment: post-procedure vital signs reviewed and stable Respiratory status: spontaneous breathing and respiratory function stable Cardiovascular status: blood pressure returned to baseline and stable Postop Assessment: no headache and no apparent nausea or vomiting Anesthetic complications: no Comments: Late entry   No notable events documented.   Last Vitals:  Vitals:   08/16/22 1121 08/16/22 1140  BP:  101/68  Pulse:  72  Resp:  (!) 22  Temp:  36.5 C  SpO2: 100% 100%    Last Pain:  Vitals:   08/16/22 1140  TempSrc: Oral  PainSc: 0-No pain                 Louann Sjogren

## 2022-08-24 ENCOUNTER — Encounter (HOSPITAL_COMMUNITY): Payer: Self-pay | Admitting: Internal Medicine

## 2022-09-18 DIAGNOSIS — G4730 Sleep apnea, unspecified: Secondary | ICD-10-CM | POA: Diagnosis not present

## 2022-10-04 ENCOUNTER — Telehealth: Payer: Self-pay

## 2022-10-04 NOTE — Telephone Encounter (Signed)
Pt's wife left a message stating they have not received the results of his sleep study or his CT. Please advise because I didn't see anything about a sleep study.

## 2022-10-05 NOTE — Telephone Encounter (Signed)
I did not order a CT scan on this patient to my knowledge?  He had a HIDA scan which was normal and you already addressed this on 9/14.  I do not know anything about a sleep study.  Sounds like he needs an office visit.  Okay to use one of my urgent spots.  Thank you

## 2022-10-08 NOTE — Telephone Encounter (Signed)
Phoned and spoke to the pt's wife and advised her of your note. She stated she thought you had ordered the sleep study but now she thinks it was Dr Renette Butters. So she is calling their office. Also advised of the pt's HIDA scan of which she replied that something was wrong because the pt had another abdomen attack. She is going to talk to dr Renette Butters first then get back with [place]. Not to be scheduled for appt right now

## 2022-10-09 DIAGNOSIS — G4733 Obstructive sleep apnea (adult) (pediatric): Secondary | ICD-10-CM | POA: Diagnosis not present

## 2022-11-02 DIAGNOSIS — G4733 Obstructive sleep apnea (adult) (pediatric): Secondary | ICD-10-CM | POA: Diagnosis not present

## 2022-12-03 DIAGNOSIS — G4733 Obstructive sleep apnea (adult) (pediatric): Secondary | ICD-10-CM | POA: Diagnosis not present

## 2022-12-12 ENCOUNTER — Encounter: Payer: Self-pay | Admitting: Internal Medicine

## 2023-01-03 DIAGNOSIS — G4733 Obstructive sleep apnea (adult) (pediatric): Secondary | ICD-10-CM | POA: Diagnosis not present

## 2023-02-01 DIAGNOSIS — G4733 Obstructive sleep apnea (adult) (pediatric): Secondary | ICD-10-CM | POA: Diagnosis not present

## 2023-02-18 ENCOUNTER — Encounter: Payer: Self-pay | Admitting: Gastroenterology

## 2023-02-18 NOTE — Progress Notes (Unsigned)
Referring Provider: Sharilyn Sites, MD Primary Care Physician:  Sharilyn Sites, MD Primary GI Physician: Dr. Abbey Chatters  No chief complaint on file.   HPI:   Darryl Turner is a 52 y.o. male presenting today with chief complaint of rectal bleeding.   Colonoscopy 08/16/22 with non-bleeding internal hemorrhoids, two 4-5 mm hyperplastic polyps removed. Recommended repeat in 10 years.   Today:     Past Medical History:  Diagnosis Date   GERD (gastroesophageal reflux disease)     Past Surgical History:  Procedure Laterality Date   COLONOSCOPY  2011   polpys removed per patient   COLONOSCOPY WITH PROPOFOL N/A 08/16/2022   Procedure: COLONOSCOPY WITH PROPOFOL;  Surgeon: [name], DO;  Location: [facility];  Service: Endoscopy;  Laterality: N/A;  12:15pm, asa 2, pt knows to arrive at 9:00   HAND SURGERY Left    2001   POLYPECTOMY  08/16/2022   Procedure: POLYPECTOMY;  Surgeon: [name], DO;  Location: [facility];  Service: Endoscopy;;    Current Outpatient Medications  Medication Sig Dispense Refill   levocetirizine (XYZAL) 5 MG tablet Take 5 mg by mouth daily.     Omega-3 Fatty Acids (FISH OIL) 1000 MG CAPS Take 1,000 mg by mouth daily.     omeprazole-sodium bicarbonate (ZEGERID) 40-1100 MG capsule Take 1 capsule by mouth daily before breakfast.     Probiotic CAPS Take 1 capsule by mouth daily. Trubiotics     No current facility-administered medications for this visit.    Allergies as of 02/20/2023   (No Known Allergies)    No family history on file.  Social History   Socioeconomic History   Marital status: Married    Spouse name: Not on file   Number of children: Not on file   Years of education: Not on file   Highest education level: Not on file  Occupational History   Not on file  Tobacco Use   Smoking status: Every Day    Types: E-cigarettes    Passive exposure: Current   Smokeless tobacco: Current  Vaping Use   Vaping Use: Some days    Substances: Nicotine  Substance and Sexual Activity   Alcohol use: Yes    Comment: socially    Drug use: Not Currently   Sexual activity: Not on file  Other Topics Concern   Not on file  Social History Narrative   Not on file   Social Determinants of Health   Financial Resource Strain: Not on file  Food Insecurity: Not on file  Transportation Needs: Not on file  Physical Activity: Not on file  Stress: Not on file  Social Connections: Not on file    Review of Systems: Gen: Denies fever, chills, anorexia. Denies fatigue, weakness, weight loss.  CV: Denies chest pain, palpitations, syncope, peripheral edema, and claudication. Resp: Denies dyspnea at rest, cough, wheezing, coughing up blood, and pleurisy. GI: Denies vomiting blood, jaundice, and fecal incontinence.   Denies dysphagia or odynophagia. Derm: Denies rash, itching, dry skin Psych: Denies depression, anxiety, memory loss, confusion. No homicidal or suicidal ideation.  Heme: Denies bruising, bleeding, and enlarged lymph nodes.  Physical Exam: There were no vitals taken for this visit. General:   Alert and oriented. No distress noted. Pleasant and cooperative.  Head:  Normocephalic and atraumatic. Eyes:  Conjuctiva clear without scleral icterus. Heart:  S1, S2 present without murmurs appreciated. Lungs:  Clear to auscultation bilaterally. No wheezes, rales, or rhonchi. No distress.  Abdomen:  +BS, soft, non-tender and non-distended. No rebound or guarding. No HSM or masses noted. Msk:  Symmetrical without gross deformities. Normal posture. Extremities:  Without edema. Neurologic:  Alert and  oriented x4 Psych:  Normal mood and affect.    Assessment:     Plan:  ***   Aliene Altes, PA-C [facility] 02/20/2023

## 2023-02-18 NOTE — H&P (View-Only) (Signed)
Referring Provider: Assunta Found, MD Primary Care Physician:  Assunta Found, MD Primary GI Physician: Dr. Marletta Lor  Chief Complaint  Patient presents with   Rectal Pain    Was having some bright red blood a couple days ago. Pain in upper right side, always tired and achy joints      HPI:   Darryl Turner is a 52 y.o. male presenting today with chief complaint of rectal bleeding. Also reporting RUQ abdominal pain and fatigue.   Colonoscopy 08/16/22 with non-bleeding internal hemorrhoids, two 4-5 mm hyperplastic polyps removed. Recommended repeat in 10 years.   Today:  Saturday a week ago, had dark red blood per rectum with morning bowel movement.  Later the same day, had dark stool as well as some bright red blood.  The following 2 days, he continued to have some bright red blood, and then his symptoms resolved.  Prior to onset, he did some heavy lifting.  Stated he spread two tons of fertilizer.  He has had some intermittent rectal bleeding in the past, but not as much as he had this time.  Denies constipation or rectal pain.  Reports he always has bowel movements after eating.  This has not changed.  Denies associated abdominal pain.  Has tenderness in the epigastric and RUQ area. Present for years. No worsening symptom after eating.  No nausea or vomiting. He will have intense cramping in the right upper quadrant if he is dehydrated.  Breakthrough heartburn several times a week and heavy burping. Taking Zegrid daily. Occasional ibuprofen use. Less than weekly.   Also reports feeling fatigued all the time.  Past Medical History:  Diagnosis Date   GERD (gastroesophageal reflux disease)     Past Surgical History:  Procedure Laterality Date   COLONOSCOPY  2011   polpys removed per patient   COLONOSCOPY WITH PROPOFOL N/A 08/16/2022   Surgeon: Lanelle Bal, DO;  non-bleeding internal hemorrhoids, two 4-5 mm hyperplastic polyps removed. Recommended repeat in 10 years.   HAND  SURGERY Left    2001   POLYPECTOMY  08/16/2022   Procedure: POLYPECTOMY;  Surgeon: Lanelle Bal, DO;  Location: [facility];  Service: Endoscopy;;    Current Outpatient Medications  Medication Sig Dispense Refill   levocetirizine (XYZAL) 5 MG tablet Take 5 mg by mouth daily.     pantoprazole (PROTONIX) 40 MG tablet Take 1 tablet (40 mg total) by mouth daily before breakfast. 30 tablet 3   Probiotic CAPS Take 1 capsule by mouth daily. Trubiotics     Omega-3 Fatty Acids (FISH OIL) 1000 MG CAPS Take 1,000 mg by mouth daily. (Patient not taking: Reported on 02/20/2023)     No current facility-administered medications for this visit.    Allergies as of 02/20/2023   (No Known Allergies)    History reviewed. No pertinent family history.  Social History   Socioeconomic History   Marital status: Married    Spouse name: Not on file   Number of children: Not on file   Years of education: Not on file   Highest education level: Not on file  Occupational History   Not on file  Tobacco Use   Smoking status: Every Day    Types: E-cigarettes    Passive exposure: Current   Smokeless tobacco: Current  Vaping Use   Vaping Use: Some days   Substances: Nicotine  Substance and Sexual Activity   Alcohol use: Yes    Comment: socially    Drug use:  Not Currently   Sexual activity: Not on file  Other Topics Concern   Not on file  Social History Narrative   Not on file   Social Determinants of Health   Financial Resource Strain: Not on file  Food Insecurity: Not on file  Transportation Needs: Not on file  Physical Activity: Not on file  Stress: Not on file  Social Connections: Not on file    Review of Systems: Gen: Denies fever, chills, cold or flulike symptoms, presyncope, syncope. CV: Denies chest pain, palpitations. Resp: Denies dyspnea, cough. GI: See HPI Heme: See HPI  Physical Exam: BP 138/79 (BP Location: Left Arm, Patient Position: Sitting, Cuff Size: Normal)    Pulse 80   Temp (!) 97.4 F (36.3 C) (Temporal)   Ht  (1.727 m)   Wt 192 lb 6.4 oz (87.3 kg)   SpO2 97%   BMI 29.25 kg/m  General:   Alert and oriented. No distress noted. Pleasant and cooperative.  Head:  Normocephalic and atraumatic. Eyes:  Conjuctiva clear without scleral icterus. Heart:  S1, S2 present without murmurs appreciated. Lungs:  Clear to auscultation bilaterally. No wheezes, rales, or rhonchi. No distress.  Abdomen:  +BS, soft, and non-distended.  Fairly significant tenderness to palpation in the epigastric area just below the xiphoid process and in the right upper quadrant region.  No rebound or guarding. No HSM or masses noted. Msk:  Symmetrical without gross deformities. Normal posture. Extremities:  Without edema. Neurologic:  Alert and  oriented x4 Psych:  Normal mood and affect.    Assessment:  52 year old male with history of GERD, internal hemorrhoids, hyperplastic colon polyps, chronic abdominal pain, presenting today with chief complaint of rectal bleeding, abdominal pain, and fatigue.  Rectal bleeding: History of intermittent low-volume rectal bleeding, increased volume of rectal bleeding about 1 week ago.  Reports that red blood per rectum as well as some dark red blood.  Also states he had some dark stool.  Denies regular NSAID use.  Colonoscopy up-to-date in September 2023 with internal hemorrhoids and 2 hyperplastic polyps removed.  Suspect etiology of rectal bleeding likely due to internal hemorrhoids in the setting of heavy lifting.  However, with reports of dark stools, unable to rule out upper GI bleeding as well. He is reporting feeling fatigued. We will check CBC and arrange EGD.   GERD:  Chronic.  Not adequately managed on Zegerid.  We will change PPI .  He also needs EGD for Barrett's esophagus screening.   Upper abdominal pain: Chronic epigastric and RUQ abdominal tenderness.  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  Denies association with meals or associated nausea, vomiting.  He does have chronic GERD that is not adequately managed and recently with dark stools.  RUQ ultrasound August 2023 with no cholelithiasis or cholecystitis though patient had positive Murphy sign.  Follow-up HIDA in September 2023 was normal.  Overall, suspect MSK etiology; however, we are arranging EGD due to reports of dark stools, uncontrolled GERD, and need for Barrett's esophagus screening.  This will rule out other underlying etiologies such as gastritis, duodenitis, PUD contributing to his upper abdominal pain. I will also update labs.     Plan:  CBC, CMP Proceed with upper endoscopy with propofol by Dr. Marletta Lor in near future. The risks, benefits, and alternatives have been discussed with the patient in detail. The patient states understanding and desires to proceed.  ASA 2 Stop  send GERD. Start pantoprazole 40 mg daily. Counseled on GERD diet/lifestyle.  Written instructions provided on AVS. Anusol rectal cream twice daily as needed for rectal bleeding/hemorrhoids. Follow-up after EGD.   Ermalinda Memos, PA-C [facility] 02/20/2023

## 2023-02-20 ENCOUNTER — Ambulatory Visit (INDEPENDENT_AMBULATORY_CARE_PROVIDER_SITE_OTHER): Payer: BC Managed Care – PPO | Admitting: Gastroenterology

## 2023-02-20 ENCOUNTER — Encounter: Payer: Self-pay | Admitting: *Deleted

## 2023-02-20 ENCOUNTER — Encounter: Payer: Self-pay | Admitting: Gastroenterology

## 2023-02-20 VITALS — BP 138/79 | HR 80 | Temp 97.4°F | Ht 68.0 in | Wt 192.4 lb

## 2023-02-20 DIAGNOSIS — K649 Unspecified hemorrhoids: Secondary | ICD-10-CM

## 2023-02-20 DIAGNOSIS — K219 Gastro-esophageal reflux disease without esophagitis: Secondary | ICD-10-CM

## 2023-02-20 DIAGNOSIS — R1010 Upper abdominal pain, unspecified: Secondary | ICD-10-CM | POA: Diagnosis not present

## 2023-02-20 DIAGNOSIS — R5383 Other fatigue: Secondary | ICD-10-CM

## 2023-02-20 DIAGNOSIS — K625 Hemorrhage of anus and rectum: Secondary | ICD-10-CM | POA: Diagnosis not present

## 2023-02-20 MED ORDER — PANTOPRAZOLE SODIUM 40 MG PO TBEC: 40.0000 mg | DELAYED_RELEASE_TABLET | Freq: Every day | ORAL | 3 refills | Status: DC

## 2023-02-20 NOTE — Patient Instructions (Addendum)
Please have blood work completed at [facility] [address], [place], [place] [address]  We will arrange for you to have an upper endoscopy in the near future with Dr. Abbey Chatters.  Stop Zegerid and start pantoprazole 40 mg daily 30 minutes before breakfast for reflux.  Follow a GERD diet:  Avoid fried, fatty, greasy, spicy, citrus foods. Avoid caffeine and carbonated beverages. Avoid chocolate. Try eating 4-6 small meals a day rather than 3 large meals. Do not eat within 3 hours of laying down. Prop head of bed up on wood or bricks to create a 6 inch incline.  I suspect your rectal bleeding was secondary to hemorrhoids.  These were seen on your last colonoscopy in September.  If you have any recurrent rectal bleeding, you can use over-the-counter Preparation H twice daily for about 7 days.  We will plan to see you back in the office after your upper endoscopy.  Do not hesitate to call sooner if you have questions or concerns.  It was nice to meet you today!   Aliene Altes, PA-C [facility]

## 2023-02-25 ENCOUNTER — Telehealth: Payer: Self-pay

## 2023-02-25 NOTE — Telephone Encounter (Signed)
Pt's wife called to check on the status of a PA for this pt's pantoprazole.

## 2023-02-26 DIAGNOSIS — R1010 Upper abdominal pain, unspecified: Secondary | ICD-10-CM | POA: Diagnosis not present

## 2023-02-26 NOTE — Telephone Encounter (Signed)
Spoke to pt and wife, informed them that I was working on [place]. They voiced understanding.

## 2023-02-27 LAB — CBC WITH DIFFERENTIAL/PLATELET
Absolute Monocytes: 391 cells/uL (ref 200–950)
Basophils Absolute: 18 cells/uL (ref 0–200)
Basophils Relative: 0.4 %
Eosinophils Absolute: 51 cells/uL (ref 15–500)
Eosinophils Relative: 1.1 %
HCT: 41.5 % (ref 38.5–50.0)
Hemoglobin: 14.2 g/dL (ref 13.2–17.1)
Lymphs Abs: 1817 cells/uL (ref 850–3900)
MCH: 32.1 pg (ref 27.0–33.0)
MCHC: 34.2 g/dL (ref 32.0–36.0)
MCV: 93.9 fL (ref 80.0–100.0)
MPV: 10.6 fL (ref 7.5–12.5)
Monocytes Relative: 8.5 %
Neutro Abs: 2323 cells/uL (ref 1500–7800)
Neutrophils Relative %: 50.5 %
Platelets: 244 10*3/uL (ref 140–400)
RBC: 4.42 10*6/uL (ref 4.20–5.80)
RDW: 12 % (ref 11.0–15.0)
Total Lymphocyte: 39.5 %
WBC: 4.6 10*3/uL (ref 3.8–10.8)

## 2023-02-27 LAB — COMPLETE METABOLIC PANEL WITH GFR
AG Ratio: 1.8 (calc) (ref 1.0–2.5)
ALT: 22 U/L (ref 9–46)
AST: 19 U/L (ref 10–35)
Albumin: 4.6 g/dL (ref 3.6–5.1)
Alkaline phosphatase (APISO): 74 U/L (ref 35–144)
BUN: 16 mg/dL (ref 7–25)
CO2: 25 mmol/L (ref 20–32)
Calcium: 9.3 mg/dL (ref 8.6–10.3)
Chloride: 104 mmol/L (ref 98–110)
Creat: 1.01 mg/dL (ref 0.70–1.30)
Globulin: 2.6 g/dL (calc) (ref 1.9–3.7)
Glucose, Bld: 87 mg/dL (ref 65–139)
Potassium: 4 mmol/L (ref 3.5–5.3)
Sodium: 139 mmol/L (ref 135–146)
Total Bilirubin: 0.4 mg/dL (ref 0.2–1.2)
Total Protein: 7.2 g/dL (ref 6.1–8.1)
eGFR: 91 mL/min/{1.73_m2} (ref >=60)

## 2023-02-28 ENCOUNTER — Other Ambulatory Visit: Payer: Self-pay | Admitting: Gastroenterology

## 2023-02-28 DIAGNOSIS — K219 Gastro-esophageal reflux disease without esophagitis: Secondary | ICD-10-CM

## 2023-02-28 MED ORDER — LANSOPRAZOLE 30 MG PO CPDR: 30.0000 mg | DELAYED_RELEASE_CAPSULE | Freq: Every day | ORAL | 3 refills | Status: DC

## 2023-03-04 DIAGNOSIS — G4733 Obstructive sleep apnea (adult) (pediatric): Secondary | ICD-10-CM | POA: Diagnosis not present

## 2023-03-08 ENCOUNTER — Encounter (HOSPITAL_COMMUNITY)
Admission: RE | Disposition: A | Discharge status: Home / Self Care | Payer: Self-pay | Source: Home / Self Care | Attending: Internal Medicine

## 2023-03-08 ENCOUNTER — Ambulatory Visit (HOSPITAL_COMMUNITY)
Admission: RE | Admit: 2023-03-08 | Discharge: 2023-03-08 | Disposition: A | Discharge status: Home / Self Care | Payer: BC Managed Care – PPO | Attending: Internal Medicine | Admitting: Internal Medicine

## 2023-03-08 ENCOUNTER — Other Ambulatory Visit: Payer: Self-pay

## 2023-03-08 ENCOUNTER — Ambulatory Visit (HOSPITAL_COMMUNITY): Payer: BC Managed Care – PPO | Admitting: Anesthesiology

## 2023-03-08 ENCOUNTER — Encounter (HOSPITAL_COMMUNITY): Payer: Self-pay

## 2023-03-08 DIAGNOSIS — R12 Heartburn: Secondary | ICD-10-CM | POA: Diagnosis not present

## 2023-03-08 DIAGNOSIS — K2970 Gastritis, unspecified, without bleeding: Secondary | ICD-10-CM | POA: Diagnosis not present

## 2023-03-08 DIAGNOSIS — F17200 Nicotine dependence, unspecified, uncomplicated: Secondary | ICD-10-CM | POA: Insufficient documentation

## 2023-03-08 DIAGNOSIS — R1010 Upper abdominal pain, unspecified: Secondary | ICD-10-CM | POA: Diagnosis not present

## 2023-03-08 DIAGNOSIS — R1011 Right upper quadrant pain: Secondary | ICD-10-CM | POA: Diagnosis not present

## 2023-03-08 DIAGNOSIS — K219 Gastro-esophageal reflux disease without esophagitis: Secondary | ICD-10-CM | POA: Insufficient documentation

## 2023-03-08 HISTORY — PX: BIOPSY: SHX5522

## 2023-03-08 HISTORY — PX: ESOPHAGOGASTRODUODENOSCOPY (EGD) WITH PROPOFOL: SHX5813

## 2023-03-08 SURGERY — ESOPHAGOGASTRODUODENOSCOPY (EGD) WITH PROPOFOL
Anesthesia: General

## 2023-03-08 MED ORDER — LACTATED RINGERS IV SOLN
INTRAVENOUS | Status: DC
  Administered 2023-03-08: 1000 mL via INTRAVENOUS

## 2023-03-08 MED ORDER — LACTATED RINGERS IV SOLN: INTRAVENOUS | Status: DC

## 2023-03-08 MED ORDER — PROPOFOL 10 MG/ML IV BOLUS
INTRAVENOUS | Status: DC | PRN
  Administered 2023-03-08: 50 mg via INTRAVENOUS
  Administered 2023-03-08: 100 mg via INTRAVENOUS

## 2023-03-08 MED ORDER — LIDOCAINE HCL (CARDIAC) PF 100 MG/5ML IV SOSY
PREFILLED_SYRINGE | INTRAVENOUS | Status: DC | PRN
  Administered 2023-03-08: 50 mg via INTRAVENOUS

## 2023-03-08 NOTE — Anesthesia Postprocedure Evaluation (Signed)
Anesthesia Post Note  Patient: [employer].  Procedure(s) Performed: ESOPHAGOGASTRODUODENOSCOPY (EGD) WITH PROPOFOL BIOPSY  Patient location during evaluation: Phase II Anesthesia Type: General Level of consciousness: awake Pain management: pain level controlled Vital Signs Assessment: post-procedure vital signs reviewed and stable Respiratory status: spontaneous breathing and respiratory function stable Cardiovascular status: blood pressure returned to baseline and stable Postop Assessment: no headache and no apparent nausea or vomiting Anesthetic complications: no Comments: Late entry   No notable events documented.   Last Vitals:  Vitals:   03/08/23 0818 03/08/23 0936  BP: (!) 130/98 111/64  Pulse: 70 73  Resp: 13 19  Temp: 36.8 C 36.4 C  SpO2: 100% 95%    Last Pain:  Vitals:   03/08/23 0936  TempSrc: Oral  PainSc: 0-No pain                 Windell Norfolk

## 2023-03-08 NOTE — Interval H&P Note (Signed)
History and Physical Interval Note:  03/08/2023 9:06 AM  Darryl Gittins Jr.  has presented today for surgery, with the diagnosis of GERD,upper abd pain.  The various methods of treatment have been discussed with the patient and family. After consideration of risks, benefits and other options for treatment, the patient has consented to  Procedure(s) with comments: ESOPHAGOGASTRODUODENOSCOPY (EGD) WITH PROPOFOL (N/A) - 2:30 pm as a surgical intervention.  The patient's history has been reviewed, patient examined, no change in status, stable for surgery.  I have reviewed the patient's chart and labs.  Questions were answered to the patient's satisfaction.     Lanelle Bal

## 2023-03-08 NOTE — Transfer of Care (Signed)
Immediate Anesthesia Transfer of Care Note  Patient: [employer].  Procedure(s) Performed: ESOPHAGOGASTRODUODENOSCOPY (EGD) WITH PROPOFOL BIOPSY  Patient Location: Endoscopy Unit  Anesthesia Type:General  Level of Consciousness: awake and alert   Airway & Oxygen Therapy: Patient Spontanous Breathing  Post-op Assessment: Report given to RN and Post -op Vital signs reviewed and stable  Post vital signs: Reviewed and stable  Last Vitals:  Vitals Value Taken Time  BP 111/64 03/08/23 0936  Temp 36.4 C 03/08/23 0936  Pulse 73 03/08/23 0936  Resp 19 03/08/23 0936  SpO2 95 % 03/08/23 0936    Last Pain:  Vitals:   03/08/23 0936  TempSrc: Oral  PainSc: 0-No pain      Patients Stated Pain Goal: 6 (03/08/23 0818)  Complications: No notable events documented.

## 2023-03-08 NOTE — Op Note (Signed)
[facility] Patient Name: Darryl Turner Procedure Date: 03/08/2023 9:21 AM MRN: [id number] Date of Birth: 06-26-1971 Attending MD: Hennie Duos. Marletta Lor , [place], [id number] CSN: [id number] Age: 52 Admit Type: Outpatient Procedure:                Upper GI endoscopy Indications:              Abdominal pain in the right upper quadrant,                            Heartburn Providers:                Hennie Duos. Marletta Lor, DO, Jannett Celestine, RN, Pandora Leiter, Technician Referring MD:              Medicines:                See the Anesthesia note for documentation of the                            administered medications Complications:            No immediate complications. Estimated Blood Loss:     Estimated blood loss was minimal. Procedure:                Pre-Anesthesia Assessment:                           - The anesthesia plan was to use monitored                            anesthesia care (MAC).                           After obtaining informed consent, the endoscope was                            passed under direct vision. Throughout the                            procedure, the patient's blood pressure, pulse, and                            oxygen saturations were monitored continuously. The                            [id number]-H190 ([id number]) scope was introduced through the                            mouth, and advanced to the second part of duodenum.                            The upper GI endoscopy was accomplished without                            difficulty. The patient tolerated the  procedure                            well. Scope In: 9:30:32 AM Scope Out: 9:33:38 AM Total Procedure Duration: 0 hours 3 minutes 6 seconds  Findings:      The Z-line was regular and was found 40 cm from the incisors.      The examined esophagus was normal.      Patchy mild inflammation characterized by erythema was found in the       gastric body and in the gastric antrum.  Biopsies were taken with a cold       forceps for Helicobacter pylori testing.      The duodenal bulb, first portion of the duodenum and second portion of       the duodenum were normal. Impression:               - Z-line regular, 40 cm from the incisors.                           - Normal esophagus.                           - Gastritis. Biopsied.                           - Normal duodenal bulb, first portion of the                            duodenum and second portion of the duodenum. Moderate Sedation:      Per Anesthesia Care Recommendation:           - Patient has a contact number available for                            emergencies. The signs and symptoms of potential                            delayed complications were discussed with the                            patient. Return to normal activities tomorrow.                            Written discharge instructions were provided to the                            patient.                           - Resume previous diet.                           - Continue present medications.                           - Await pathology results.                           -  Use Protonix (pantoprazole) 40 mg PO daily.                           - Return to GI clinic in 3 months. Procedure Code(s):        --- Professional ---                           [phone], Esophagogastroduodenoscopy, flexible,                            transoral; with biopsy, single or multiple Diagnosis Code(s):        --- Professional ---                           K29.70, Gastritis, unspecified, without bleeding                           R10.11, Right upper quadrant pain                           R12, Heartburn CPT copyright 2022 American Medical Association. All rights reserved. The codes documented in this report are preliminary and upon coder review may  be revised to meet current compliance requirements. Hennie Duos. Marletta Lor, DO Hennie Duos. Marletta Lor, DO 03/08/2023 9:37:01  AM This report has been signed electronically. Number of Addenda: 0

## 2023-03-08 NOTE — Discharge Instructions (Addendum)
EGD Discharge instructions Please read the instructions outlined below and refer to this sheet in the next few weeks. These discharge instructions provide you with general information on caring for yourself after you leave the hospital. Your doctor may also give you specific instructions. While your treatment has been planned according to the most current medical practices available, unavoidable complications occasionally occur. If you have any problems or questions after discharge, please call your doctor. ACTIVITY You may resume your regular activity but move at a slower pace for the next 24 hours.  Take frequent rest periods for the next 24 hours.  Walking will help expel (get rid of) the air and reduce the bloated feeling in your abdomen.  No driving for 24 hours (because of the anesthesia (medicine) used during the test).  You may shower.  Do not sign any important legal documents or operate any machinery for 24 hours (because of the anesthesia used during the test).  NUTRITION Drink plenty of fluids.  You may resume your normal diet.  Begin with a light meal and progress to your normal diet.  Avoid alcoholic beverages for 24 hours or as instructed by your caregiver.  MEDICATIONS You may resume your normal medications unless your caregiver tells you otherwise.  WHAT YOU CAN EXPECT TODAY You may experience abdominal discomfort such as a feeling of fullness or "gas" pains.  FOLLOW-UP Your doctor will discuss the results of your test with you.  SEEK IMMEDIATE MEDICAL ATTENTION IF ANY OF THE FOLLOWING OCCUR: Excessive nausea (feeling sick to your stomach) and/or vomiting.  Severe abdominal pain and distention (swelling).  Trouble swallowing.  Temperature over 101 F (37.8 C).  Rectal bleeding or vomiting of blood.    Your EGD revealed mild amount inflammation in your stomach.  I took biopsies of this to rule out infection with a bacteria called H. pylori.  Await pathology results, my  office will contact you.  Esophagus and small bowel appeared normal.  Continue on pantoprazole daily.  Follow-up with GI in 3 months.  OFFICE WILL NOTIFY YOU  I hope you have a great rest of your week!  Hennie Duos. Marletta Lor, D.O. Gastroenterology and Hepatology [facility]

## 2023-03-08 NOTE — Anesthesia Preprocedure Evaluation (Signed)
Anesthesia Evaluation  Patient identified by MRN, date of birth, ID band Patient awake    Reviewed: Allergy & Precautions, H&P , NPO status , Patient's Chart, lab work & pertinent test results, reviewed documented beta blocker date and time   Airway Mallampati: II  TM Distance: >3 FB Neck ROM: full    Dental no notable dental hx.    Pulmonary neg pulmonary ROS, Current Smoker   Pulmonary exam normal breath sounds clear to auscultation       Cardiovascular Exercise Tolerance: Good negative cardio ROS  Rhythm:regular Rate:Normal     Neuro/Psych negative neurological ROS  negative psych ROS   GI/Hepatic negative GI ROS, Neg liver ROS,GERD  ,,  Endo/Other  negative endocrine ROS    Renal/GU negative Renal ROS  negative genitourinary   Musculoskeletal   Abdominal   Peds  Hematology negative hematology ROS (+)   Anesthesia Other Findings   Reproductive/Obstetrics negative OB ROS                             Anesthesia Physical Anesthesia Plan  ASA: 2  Anesthesia Plan: General   Post-op Pain Management:    Induction:   PONV Risk Score and Plan: Propofol infusion  Airway Management Planned:   Additional Equipment:   Intra-op Plan:   Post-operative Plan:   Informed Consent: I have reviewed the patients History and Physical, chart, labs and discussed the procedure including the risks, benefits and alternatives for the proposed anesthesia with the patient or authorized representative who has indicated his/her understanding and acceptance.     Dental Advisory Given  Plan Discussed with: CRNA  Anesthesia Plan Comments:        Anesthesia Quick Evaluation  

## 2023-03-11 LAB — SURGICAL PATHOLOGY

## 2023-03-14 ENCOUNTER — Encounter (HOSPITAL_COMMUNITY): Payer: Self-pay | Admitting: Internal Medicine

## 2023-04-03 DIAGNOSIS — G4733 Obstructive sleep apnea (adult) (pediatric): Secondary | ICD-10-CM | POA: Diagnosis not present

## 2023-04-08 ENCOUNTER — Encounter: Payer: Self-pay | Admitting: Internal Medicine

## 2023-05-04 DIAGNOSIS — G4733 Obstructive sleep apnea (adult) (pediatric): Secondary | ICD-10-CM | POA: Diagnosis not present

## 2023-06-03 DIAGNOSIS — G4733 Obstructive sleep apnea (adult) (pediatric): Secondary | ICD-10-CM | POA: Diagnosis not present

## 2023-06-07 ENCOUNTER — Ambulatory Visit: Payer: BC Managed Care – PPO | Admitting: Gastroenterology

## 2023-06-27 NOTE — Progress Notes (Signed)
Referring Provider: Assunta Found, MD Primary Care Physician:  Assunta Found, MD Primary GI Physician: Dr. Marletta Lor  Chief Complaint  Patient presents with   Follow-up    Follow up. 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   Last seen in the office 02/20/2023.  Reported reported 3 days of rectal bleeding and single episode of dark stool about 1 week ago.  Noted he did some heavy lifting prior to onset.  Denied associated abdominal pain.  Also reported tenderness in the epigastric and RUQ region that have been present for many years, not affected by meals.  Reported intense cramping in RUQ if dehydrated.  Also with breakthrough heartburn several times a week and heavy burping taking Zegerid daily.  Was using ibuprofen less than weekly.  Planned to update labs, arrange EGD, stop Zegerid, start pantoprazole 40 mg daily, Anusol rectal cream.   CBC and CMP completely normal.   EGD 03/08/2023: Gastritis biopsied, otherwise normal exam.  Gastric biopsies were benign.  Today:   Rectal bleeding:  No recurrent rectal bleeding. Bowels moving well.   GERD:  Doing well with pantoprazole 40 mg daily.  No dysphagia, nausea, vomiting.  Epigastric pain:  Improved. Has to be careful if working in the heat to stay well hydrated.  He does continue to have soreness along the lower rib cage which is a chronic problem for him.  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  States he has also been doing a lot of heavy lifting this week.  No postprandial symptoms.   Past Medical History:  Diagnosis Date   GERD (gastroesophageal reflux disease)     Past Surgical History:  Procedure Laterality Date   BIOPSY  03/08/2023   Procedure: BIOPSY;  Surgeon:  Lanelle Bal, DO;  [facility]: [facility];  Service: Endoscopy;;   COLONOSCOPY  2011   polpys removed per patient   COLONOSCOPY WITH PROPOFOL N/A 08/16/2022   Surgeon: Lanelle Bal, DO;  non-bleeding internal hemorrhoids, two 4-5 mm hyperplastic polyps removed. Recommended repeat in 10 years.   ESOPHAGOGASTRODUODENOSCOPY (EGD) WITH PROPOFOL N/A 03/08/2023   Dr. Marletta Lor; Gastritis biopsied, otherwise normal exam.  Gastric biopsies were benign.   HAND SURGERY Left    2001   POLYPECTOMY  08/16/2022   Procedure: POLYPECTOMY;  Surgeon: Lanelle Bal, DO;  Location: [facility];  Service: Endoscopy;;    Current Outpatient Medications  Medication Sig Dispense Refill   Lactobacillus Rhamnosus, GG, (CULTURELLE HEALTH & WELLNESS PO) Take 1 capsule by mouth daily.     levocetirizine (XYZAL) 5 MG tablet Take 5 mg by mouth every evening.     Omega-3 Fatty Acids (FISH OIL PO) Take 2,800 mg by mouth daily.     pantoprazole (PROTONIX) 40 MG tablet Take 1 tablet (40 mg total) by mouth daily before breakfast. 30 tablet 5   No current facility-administered medications for this visit.    Allergies as of 06/28/2023   (No Known Allergies)    History reviewed. No pertinent family history.  Social History   Socioeconomic History   Marital status: Married    Spouse name: Not on file   Number of children: Not on file   Years of education: Not on file  Highest education level: Not on file  Occupational History   Not on file  Tobacco Use   Smoking status: Every Day    Types: E-cigarettes    Passive exposure: Current   Smokeless tobacco: Current  Vaping Use   Vaping status: Some Days   Substances: Nicotine  Substance and Sexual Activity   Alcohol use: Yes    Alcohol/week: 3.0 standard drinks of alcohol    Types: 3 Cans of beer per week    Comment: socially    Drug use: Not Currently   Sexual activity: Not on file  Other Topics Concern   Not on file  Social History  Narrative   Not on file   Social Determinants of Health   Financial Resource Strain: Not on file  Food Insecurity: Not on file  Transportation Needs: Not on file  Physical Activity: Not on file  Stress: Not on file  Social Connections: Not on file    Review of Systems: Gen: Denies fever, chills, cold or flulike symptoms, presyncope, syncope. CV: Denies chest pain, palpitations. Resp: Denies dyspnea, cough. GI: See HPI Heme: See HPI  Physical Exam: BP 131/80 (BP Location: Right Arm, Patient Position: Sitting, Cuff Size: Normal)   Pulse 93   Temp 98.1 F (36.7 C) (Temporal)   Ht 5\' 8"  (1.727 m)   Wt 194 lb (88 kg)   SpO2 97%   BMI 29.50 kg/m  General:   Alert and oriented. No distress noted. Pleasant and cooperative.  Head:  Normocephalic and atraumatic. Eyes:  Conjuctiva clear without scleral icterus. Heart:  S1, S2 present without murmurs appreciated. Lungs:  Clear to auscultation bilaterally. No wheezes, rales, or rhonchi. No distress.  Abdomen:  +BS, soft, and non-distended.  Mild TTP along costal margin bilaterally.  No rebound or guarding. No HSM or masses noted. Msk:  Symmetrical without gross deformities. Normal posture. Extremities:  Without edema. Neurologic:  Alert and  oriented x4 Psych:  Normal mood and affect.    Assessment:   52 y.o. male with history of GERD, internal hemorrhoids, hyperplastic colon polyps with colonoscopy up-to-date in September 2023, chronic abdominal pain, presenting today for follow-up of rectal bleeding, upper abdominal pain, GERD.   GERD: Doing well after transitioning from Zegerid to pantoprazole 40 mg daily.  Upper abdominal pain: Overall improved.  Still has tenderness along the costal margin bilaterally.  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  Recent EGD with gastritis with benign biopsies, otherwise normal exam.  Rectal bleeding: Experienced  3 days of rectal bleeding in March 2024 with no recurrent symptoms.  Suspect this was likely related to hemorrhoids in the setting of heavy lifting.  Colonoscopy up-to-date in September 2023 with internal hemorrhoids and 2 hyperplastic polyps.  He is due for routine screening in 2033.   Plan:  Continue pantoprazole 40 mg daily. Follow-up in 6 months or sooner if needed.   Ermalinda Memos, PA-C [facility] 06/28/2023

## 2023-06-28 ENCOUNTER — Encounter: Payer: Self-pay | Admitting: Gastroenterology

## 2023-06-28 ENCOUNTER — Ambulatory Visit: Payer: BC Managed Care – PPO | Admitting: Gastroenterology

## 2023-06-28 VITALS — BP 131/80 | HR 93 | Temp 98.1°F | Ht 68.0 in | Wt 194.0 lb

## 2023-06-28 DIAGNOSIS — K219 Gastro-esophageal reflux disease without esophagitis: Secondary | ICD-10-CM | POA: Diagnosis not present

## 2023-06-28 DIAGNOSIS — R1010 Upper abdominal pain, unspecified: Secondary | ICD-10-CM | POA: Diagnosis not present

## 2023-06-28 DIAGNOSIS — K625 Hemorrhage of anus and rectum: Secondary | ICD-10-CM | POA: Diagnosis not present

## 2023-06-28 MED ORDER — PANTOPRAZOLE SODIUM 40 MG PO TBEC: 40.0000 mg | DELAYED_RELEASE_TABLET | Freq: Every day | ORAL | 5 refills | Status: DC

## 2023-06-28 NOTE — Patient Instructions (Addendum)
Continue to pantoprazole 40 mg daily to control acid reflux.  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   I suspect your prior rectal bleeding was secondary to internal hemorrhoids.  We will follow-up with you in 6 months or sooner if needed.  It was great to see you again today!  Ermalinda Memos, PA-C [facility]

## 2023-07-04 DIAGNOSIS — G4733 Obstructive sleep apnea (adult) (pediatric): Secondary | ICD-10-CM | POA: Diagnosis not present

## 2023-08-04 DIAGNOSIS — G4733 Obstructive sleep apnea (adult) (pediatric): Secondary | ICD-10-CM | POA: Diagnosis not present

## 2023-08-19 DIAGNOSIS — R1011 Right upper quadrant pain: Secondary | ICD-10-CM | POA: Diagnosis not present

## 2023-08-19 DIAGNOSIS — R7309 Other abnormal glucose: Secondary | ICD-10-CM | POA: Diagnosis not present

## 2023-08-19 DIAGNOSIS — G4733 Obstructive sleep apnea (adult) (pediatric): Secondary | ICD-10-CM | POA: Diagnosis not present

## 2023-08-19 DIAGNOSIS — Z1331 Encounter for screening for depression: Secondary | ICD-10-CM | POA: Diagnosis not present

## 2023-08-19 DIAGNOSIS — Z0000 Encounter for general adult medical examination without abnormal findings: Secondary | ICD-10-CM | POA: Diagnosis not present

## 2023-08-19 DIAGNOSIS — Z6828 Body mass index (BMI) 28.0-28.9, adult: Secondary | ICD-10-CM | POA: Diagnosis not present

## 2023-08-19 DIAGNOSIS — E785 Hyperlipidemia, unspecified: Secondary | ICD-10-CM | POA: Diagnosis not present

## 2023-09-13 ENCOUNTER — Other Ambulatory Visit: Payer: Self-pay | Admitting: *Deleted

## 2023-09-13 DIAGNOSIS — R1011 Right upper quadrant pain: Secondary | ICD-10-CM

## 2023-09-17 ENCOUNTER — Ambulatory Visit (INDEPENDENT_AMBULATORY_CARE_PROVIDER_SITE_OTHER): Payer: BC Managed Care – PPO | Admitting: General Surgery

## 2023-09-17 ENCOUNTER — Encounter: Payer: Self-pay | Admitting: General Surgery

## 2023-09-17 VITALS — BP 132/84 | HR 69 | Temp 97.8°F | Resp 14 | Ht 68.0 in | Wt 192.0 lb

## 2023-09-17 DIAGNOSIS — R1011 Right upper quadrant pain: Secondary | ICD-10-CM | POA: Diagnosis not present

## 2023-09-17 DIAGNOSIS — K811 Chronic cholecystitis: Secondary | ICD-10-CM

## 2023-09-17 NOTE — Patient Instructions (Signed)
Minimally Invasive Cholecystectomy Minimally invasive cholecystectomy is surgery to remove the gallbladder. The gallbladder is a pear-shaped organ that lies beneath the liver on the right side of the body. The gallbladder stores bile, which is a fluid that helps the body digest fats. Cholecystectomy is often done to treat inflammation (irritation and swelling) of the gallbladder (cholecystitis). This condition is usually caused by a buildup of gallstones (cholelithiasis) in the gallbladder or when the fluid in the gall bladder becomes stagnant because gallstones get stuck in the ducts (tubes) and block the flow of bile. This can result in inflammation and pain. In severe cases, emergency surgery may be required. This procedure is done through small incisions in the abdomen, instead of one large incision. It is also called laparoscopic surgery. A thin scope with a camera (laparoscope) is inserted through one incision. Then surgical instruments are inserted through the other incisions. In some cases, a minimally invasive surgery may need to be changed to a surgery that is done through a larger incision. This is called open surgery. Tell a health care provider about: Any allergies you have. All medicines you are taking, including vitamins, herbs, eye drops, creams, and over-the-counter medicines. Any problems you or family members have had with anesthetic medicines. Any bleeding problems you have. Any surgeries you have had. Any medical conditions you have. Whether you are pregnant or may be pregnant. What are the risks? Generally, this is a safe procedure. However, problems may occur, including: Infection. Bleeding. Allergic reactions to medicines. Damage to nearby structures or organs. A gallstone remaining in the common bile duct. The common bile duct carries bile from the gallbladder to the small intestine. A bile leak from the liver or cystic duct after your gallbladder is removed. What happens  before the procedure? Medicines Ask your health care provider about: Changing or stopping your regular medicines. This is especially important if you are taking diabetes medicines or blood thinners. Taking medicines such as aspirin and ibuprofen. These medicines can thin your blood. Do not take these medicines unless your health care provider tells you to take them. Taking over-the-counter medicines, vitamins, herbs, and supplements. General instructions If you will be going home right after the procedure, plan to have a responsible adult: Take you home from the hospital or clinic. You will not be allowed to drive. Care for you for the time you are told. Do not use any products that contain nicotine or tobacco for at least 4 weeks before the procedure. These products include cigarettes, chewing tobacco, and vaping devices, such as e-cigarettes. If you need help quitting, ask your health care provider. Ask your health care provider: How your surgery site will be marked. What steps will be taken to help prevent infection. These may include: Removing hair at the surgery site. Washing skin with a germ-killing soap. Taking antibiotic medicine. What happens during the procedure?  An IV will be inserted into one of your veins. You will be given one or both of the following: A medicine to help you relax (sedative). A medicine to make you fall asleep (general anesthetic). Your surgeon will make several small incisions in your abdomen. The laparoscope will be inserted through one of the small incisions. The camera on the laparoscope will send images to a monitor in the operating room. This lets your surgeon see inside your abdomen. A gas will be pumped into your abdomen. This will expand your abdomen to give the surgeon more room to perform the surgery. Other tools that  are needed for the procedure will be inserted through the other incisions. The gallbladder will be removed through one of the  incisions. Your common bile duct may be examined. If stones are found in the common bile duct, they may be removed. After your gallbladder has been removed, the incisions will be closed with stitches (sutures), staples, or skin glue. Your incisions will be covered with a bandage (dressing). The procedure may vary among health care providers and hospitals. What happens after the procedure? Your blood pressure, heart rate, breathing rate, and blood oxygen level will be monitored until you leave the hospital or clinic. You will be given medicines as needed to control your pain. You may have a drain placed in the incision. The drain will be removed a day or two after the procedure. Summary Minimally invasive cholecystectomy, also called laparoscopic cholecystectomy, is surgery to remove the gallbladder using small incisions. Tell your health care provider about all the medical conditions you have and all the medicines you are taking for those conditions. Before the procedure, follow instructions about when to stop eating and drinking and changing or stopping medicines. Plan to have a responsible adult care for you for the time you are told after you leave the hospital or clinic. This information is not intended to replace advice given to you by your health care provider. Make sure you discuss any questions you have with your health care provider. Document Revised: 05/16/2021 Document Reviewed: 05/16/2021 Elsevier Patient Education  2024 [employer].

## 2023-09-17 NOTE — Progress Notes (Addendum)
[facility] History and Physical  Reason for Referral: Gallbladder  Referring Physician: Assunta Found, MD   Chief Complaint   New Patient (Initial Visit)     Darryl Steyer Montez Hageman. is a 52 y.o. male.  HPI: Darryl Turner is a 52 yo how has been having right sided pain under his rib cage for about 4 years. He says that he can randomly have pain/ soreness in the region and feels like it spasms at times. He cannot say that this is related to  food. He has had [place] in the past that did not show stones or thickening but he did have a Murphy's sign which could represent chronic cholecystitis. He also had a HIDA that was normal. He has had EGD with GI and found to have gastritis. He feels like his worse symptoms happen when he is dehydrated or stressed. The pain is always localized under the right rib cage.   Past Medical History:  Diagnosis Date   GERD (gastroesophageal reflux disease)     Past Surgical History:  Procedure Laterality Date   BIOPSY  03/08/2023   Procedure: BIOPSY;  Surgeon: Lanelle Bal, DO;  Location: [facility];  Service: Endoscopy;;   COLONOSCOPY  2011   polpys removed per patient   COLONOSCOPY WITH PROPOFOL N/A 08/16/2022   Surgeon: Lanelle Bal, DO;  non-bleeding internal hemorrhoids, two 4-5 mm hyperplastic polyps removed. Recommended repeat in 10 years.   ESOPHAGOGASTRODUODENOSCOPY (EGD) WITH PROPOFOL N/A 03/08/2023   Dr. Marletta Lor; Gastritis biopsied, otherwise normal exam.  Gastric biopsies were benign.   HAND SURGERY Left    2001   POLYPECTOMY  08/16/2022   Procedure: POLYPECTOMY;  Surgeon: Lanelle Bal, DO;  Location: [facility];  Service: Endoscopy;;    No family history on file.  Social History   Tobacco Use   Smoking status: Every Day    Types: E-cigarettes    Passive exposure: Current   Smokeless tobacco: Former  Building services engineer status: Some Days   Substances: Nicotine  Substance Use Topics   Alcohol use: Yes     Alcohol/week: 3.0 standard drinks of alcohol    Types: 3 Cans of beer per week    Comment: socially    Drug use: Not Currently    Medications: I have reviewed the patient's current medications. Allergies as of 09/17/2023   No Known Allergies      Medication List        Accurate as of September 17, 2023  9:20 AM. If you have any questions, ask your nurse or doctor.          CULTURELLE HEALTH & WELLNESS PO Take 1 capsule by mouth daily.   FISH OIL PO Take 2,800 mg by mouth daily.   levocetirizine 5 MG tablet Commonly known as: XYZAL Take 5 mg by mouth every evening.   pantoprazole 40 MG tablet Commonly known as: PROTONIX Take 1 tablet (40 mg total) by mouth daily before breakfast.         ROS:  A comprehensive review of systems was negative except for: Gastrointestinal: positive for right upper abdomen pain under rib cage  Blood pressure 132/84, pulse 69, temperature 97.8 F (36.6 C), temperature source Oral, resp. rate 14, height 5\' 8"  (1.727 m), weight 192 lb (87.1 kg), SpO2 98%. Physical Exam Vitals reviewed.  HENT:     Head: Normocephalic.     Nose: Nose normal.  Eyes:     Extraocular Movements: Extraocular  movements intact.  Cardiovascular:     Rate and Rhythm: Normal rate and regular rhythm.  Pulmonary:     Effort: Pulmonary effort is normal.     Breath sounds: Normal breath sounds.  Abdominal:     General: There is no distension.     Palpations: Abdomen is soft.     Tenderness: There is abdominal tenderness in the right upper quadrant.  Musculoskeletal:        General: Normal range of motion.     Cervical back: Normal range of motion.  Skin:    General: Skin is warm.  Neurological:     General: No focal deficit present.     Mental Status: He is alert and oriented to person, place, and time.  Psychiatric:        Mood and Affect: Mood normal.        Behavior: Behavior normal.        Thought Content: Thought content normal.      Results: CLINICAL DATA:  RUQ pain   EXAM: ULTRASOUND ABDOMEN LIMITED RIGHT UPPER QUADRANT   COMPARISON:  None Available.   FINDINGS: Gallbladder:   No gallstones or wall thickening visualized. A subjective sonographic Eulah Pont sign is noted by sonographer. No pericholecystic fluid. Gallbladder is distended.   Common bile duct:   Diameter: Visualized portion measures 4 mm, within normal limits.   Liver:   No focal lesion identified. Diffusely increased parenchymal echogenicity with poor acoustic penetrance. Portal vein is patent on color Doppler imaging with normal direction of blood flow towards the liver.   Other: None.   IMPRESSION: 1. A positive subjective sonographic Eulah Pont sign is reported by the sonographer. No ancillary evidence of acute cholecystitis. This could reflect underlying biliary dyskinesia or degree of chronic cholecystitis. Consider further dedicated evaluation with HIDA scan for evaluation of gallbladder ejection fraction. 2. Hepatic steatosis.   These results will be called to the ordering clinician or representative by the Radiologist Assistant, and communication documented in the PACS or [employer].     Electronically Signed   By: Meda Klinefelter M.D.   On: 07/13/2022 15:39  CLINICAL DATA:  Right upper quadrant abdominal pain.   EXAM: NUCLEAR MEDICINE HEPATOBILIARY IMAGING WITH GALLBLADDER EF   TECHNIQUE: Sequential images of the abdomen were obtained out to 60 minutes following intravenous administration of radiopharmaceutical. After oral ingestion of Ensure, gallbladder ejection fraction was determined. At 60 min, normal ejection fraction is greater than 33%.   RADIOPHARMACEUTICALS:  5.4 mCi Tc-79m  Choletec IV   COMPARISON:  Ultrasound July 13, 2022   FINDINGS: Prompt uptake and biliary excretion of activity by the liver is seen. Gallbladder activity is visualized, consistent with patency of cystic duct. Biliary activity  passes into small bowel, consistent with patent common bile duct.   Calculated gallbladder ejection fraction is 79%. (Normal gallbladder ejection fraction with Ensure is greater than 33%.)   IMPRESSION: 1.  Patent cystic and common bile ducts.   2.  Normal gallbladder ejection fraction.     Electronically Signed   By: Maudry Mayhew M.D.   On: 08/07/2022 12:08   Assessment & Plan:  Darryl Murdoch Ruff Montez Hageman. is a 52 y.o. male with possibly chronic cholecystitis and this vague pain that was in the RUQ for over 4 years. He continues to get pain in this region. It is not always associated with food. He says that he is tired of worrying about this pain. He has had an extensive workup and has had gastritis  in the past.    Discussed the option of cholecystectomy and that this would at least get the gallbladder off the table for reasons for his pain. Discussed that some people do have this pain and that it can be vague.   PLAN: I counseled the patient about the indication, risks and benefits of robotic assisted laparoscopic cholecystectomy.  He understands there is a very small chance for bleeding, infection, injury to normal structures (including common bile duct), conversion to open surgery, persistent symptoms, evolution of postcholecystectomy diarrhea, need for secondary interventions, anesthesia reaction, cardiopulmonary issues and other risks not specifically detailed here. I described the expected recovery, the plan for follow-up and the restrictions during the recovery phase.  All questions were answered.   All questions were answered to the satisfaction of the patient.     Lucretia Roers 09/17/2023, 9:20 AM

## 2023-10-23 ENCOUNTER — Other Ambulatory Visit (HOSPITAL_COMMUNITY): Payer: BC Managed Care – PPO

## 2023-10-23 NOTE — Patient Instructions (Addendum)
Darryl Bova Jr.  10/23/2023     @PREFPERIOPPHARMACY @   Your procedure is scheduled on  11/01/2023.   Report to [facility] at  0600  A.M.   Call this number if you have problems the morning of surgery:  [phone]  If you experience any cold or flu symptoms such as cough, fever, chills, shortness of breath, etc. between now and your scheduled surgery, please notify us at the above number.   Remember:  Do not eat after midnight.    You may drink clear liquids until 0330 am on 11/01/2023.   Clear liquids allowed are:                    Water, Juice (No red color; non-citric and without pulp; diabetics please choose diet or no sugar options), Carbonated beverages (diabetics please choose diet or no sugar options), Clear Tea (No creamer, milk, or cream, including half & half and powdered creamer), Black Coffee Only (No creamer, milk or cream, including half & half and powdered creamer), and Clear Sports drink (No red color; diabetics please choose diet or no sugar options)    Take these medicines the morning of surgery with A SIP OF WATER                         levocetirizine, pantoprazole.    Do not wear jewelry, make-up or nail polish, including gel polish,  artificial nails, or any other type of covering on natural nails (fingers and  toes).  Do not wear lotions, powders, or perfumes, or deodorant.  Do not shave 48 hours prior to surgery.  Men may shave face and neck.  Do not bring valuables to the hospital.  [facility] is not responsible for any belongings or valuables.  Contacts, dentures or bridgework may not be worn into surgery.  Leave your suitcase in the car.  After surgery it may be brought to your room.  For patients admitted to the hospital, discharge time will be determined by your treatment team.  Patients discharged the day of surgery will not be allowed to drive home and must have someone with them for 24 hours.    Special instructions:   DO  NOT smoke tobacco or vape for 24 hours before your procedure.  Please read over the following fact sheets that you were given. Coughing and Deep Breathing, Surgical Site Infection Prevention, Anesthesia Post-op Instructions, and Care and Recovery After Surgery       Minimally Invasive Cholecystectomy, Care After The following information offers guidance on how to care for yourself after your procedure. Your health care provider may also give you more specific instructions. If you have problems or questions, contact your health care provider. What can I expect after the procedure? After the procedure, it is common to have: Pain at your incision sites. You will be given medicines to control this pain. Mild nausea or vomiting. Bloating and possible shoulder pain from the gas that was used during the procedure. Follow these instructions at home: Medicines Take over-the-counter and prescription medicines only as told by your health care provider. If you were prescribed an antibiotic medicine, take it as told by your health care provider. Do not stop using the antibiotic even if you start to feel better. Ask your health care provider if the medicine prescribed to you: Requires you to avoid driving or using machinery. Can cause constipation. You may  need to take these actions to prevent or treat constipation: Drink enough fluid to keep your urine pale yellow. Take over-the-counter or prescription medicines. Eat foods that are high in fiber, such as beans, whole grains, and fresh fruits and vegetables. Limit foods that are high in fat and processed sugars, such as fried or sweet foods. Incision care  Follow instructions from your health care provider about how to take care of your incisions. Make sure you: Wash your hands with soap and water for at least 20 seconds before and after you change your bandage (dressing). If soap and water are not available, use hand sanitizer. Change your dressing  as told by your health care provider. Leave stitches (sutures), skin glue, or adhesive strips in place. These skin closures may need to be in place for 2 weeks or longer. If adhesive strip edges start to loosen and curl up, you may trim the loose edges. Do not remove adhesive strips completely unless your health care provider tells you to do that. Do not take baths, swim, or use a hot tub until your health care provider approves. Ask your health care provider if you may take showers. You may only be allowed to take sponge baths. Check your incision area every day for signs of infection. Check for: More redness, swelling, or pain. Fluid or blood. Warmth. Pus or a bad smell. Activity Rest as told by your health care provider. Do not do activities that require a lot of effort. Avoid sitting for a long time without moving. Get up to take short walks every 1-2 hours. This is important to improve blood flow and breathing. Ask for help if you feel weak or unsteady. Do not lift anything that is heavier than 10 lb (4.5 kg), or the limit that you are told, until your health care provider says that it is safe. Do not play contact sports until your health care provider approves. Do not return to work or school until your health care provider approves. Return to your normal activities as told by your health care provider. Ask your health care provider what activities are safe for you. General instructions If you were given a sedative during the procedure, it can affect you for several hours. Do not drive or operate machinery until your health care provider says that it is safe. Keep all follow-up visits. This is important. Contact a health care provider if: You develop a rash. You have more redness, swelling, or pain around your incisions. You have fluid or blood coming from your incisions. Your incisions feel warm to the touch. You have pus or a bad smell coming from your incisions. You have a  fever. One or more of your incisions breaks open. Get help right away if: You have trouble breathing. You have chest pain. You have more pain in your shoulders. You faint or feel dizzy when you stand. You have severe pain in your abdomen. You have nausea or vomiting that lasts for more than one day. You have leg pain that is new or unusual, or if it is localized to one specific spot. These symptoms may represent a serious problem that is an emergency. Do not wait to see if the symptoms will go away. Get medical help right away. Call your local emergency services (911 in the [place] Do not drive yourself to the hospital. Summary After your procedure, it is common to have pain at the incision sites. You may also have nausea or bloating. Follow your health care  provider's instructions about medicine, activity restrictions, and caring for your incision areas. Do not do activities that require a lot of effort. Contact a health care provider if you have a fever or other signs of infection, such as more redness, swelling, or pain around the incisions. Get help right away if you have chest pain, increasing pain in the shoulders, or trouble breathing. This information is not intended to replace advice given to you by your health care provider. Make sure you discuss any questions you have with your health care provider. Document Revised: 05/16/2021 Document Reviewed: 05/16/2021 Elsevier Patient Education  2024 [employer] General Anesthesia, Adult, Care After The following information offers guidance on how to care for yourself after your procedure. Your health care provider may also give you more specific instructions. If you have problems or questions, contact your health care provider. What can I expect after the procedure? After the procedure, it is common for people to: Have pain or discomfort at the IV site. Have nausea or vomiting. Have a sore throat or hoarseness. Have trouble  concentrating. Feel cold or chills. Feel weak, sleepy, or tired (fatigue). Have soreness and body aches. These can affect parts of the body that were not involved in surgery. Follow these instructions at home: For the time period you were told by your health care provider:  Rest. Do not participate in activities where you could fall or become injured. Do not drive or use machinery. Do not drink alcohol. Do not take sleeping pills or medicines that cause drowsiness. Do not make important decisions or sign legal documents. Do not take care of children on your own. General instructions Drink enough fluid to keep your urine pale yellow. If you have sleep apnea, surgery and certain medicines can increase your risk for breathing problems. Follow instructions from your health care provider about wearing your sleep device: Anytime you are sleeping, including during daytime naps. While taking prescription pain medicines, sleeping medicines, or medicines that make you drowsy. Return to your normal activities as told by your health care provider. Ask your health care provider what activities are safe for you. Take over-the-counter and prescription medicines only as told by your health care provider. Do not use any products that contain nicotine or tobacco. These products include cigarettes, chewing tobacco, and vaping devices, such as e-cigarettes. These can delay incision healing after surgery. If you need help quitting, ask your health care provider. Contact a health care provider if: You have nausea or vomiting that does not get better with medicine. You vomit every time you eat or drink. You have pain that does not get better with medicine. You cannot urinate or have bloody urine. You develop a skin rash. You have a fever. Get help right away if: You have trouble breathing. You have chest pain. You vomit blood. These symptoms may be an emergency. Get help right away. Call 911. Do not wait  to see if the symptoms will go away. Do not drive yourself to the hospital. Summary After the procedure, it is common to have a sore throat, hoarseness, nausea, vomiting, or to feel weak, sleepy, or fatigue. For the time period you were told by your health care provider, do not drive or use machinery. Get help right away if you have difficulty breathing, have chest pain, or vomit blood. These symptoms may be an emergency. This information is not intended to replace advice given to you by your health care provider. Make sure you discuss any questions you  have with your health care provider. Document Revised: 02/09/2022 Document Reviewed: 02/09/2022 Elsevier Patient Education  2024 [employer]. How to Use Chlorhexidine Before Surgery Chlorhexidine gluconate (CHG) is a germ-killing (antiseptic) solution that is used to clean the skin. It can get rid of the bacteria that normally live on the skin and can keep them away for about 24 hours. To clean your skin with CHG, you may be given: A CHG solution to use in the shower or as part of a sponge bath. A prepackaged cloth that contains CHG. Cleaning your skin with CHG may help lower the risk for infection: While you are staying in the intensive care unit of the hospital. If you have a vascular access, such as a central line, to provide short-term or long-term access to your veins. If you have a catheter to drain urine from your bladder. If you are on a ventilator. A ventilator is a machine that helps you breathe by moving air in and out of your lungs. After surgery. What are the risks? Risks of using CHG include: A skin reaction. Hearing loss, if CHG gets in your ears and you have a perforated eardrum. Eye injury, if CHG gets in your eyes and is not rinsed out. The CHG product catching fire. Make sure that you avoid smoking and flames after applying CHG to your skin. Do not use CHG: If you have a chlorhexidine allergy or have previously reacted  to chlorhexidine. On babies younger than 72 months of age. How to use CHG solution Use CHG only as told by your health care provider, and follow the instructions on the label. Use the full amount of CHG as directed. Usually, this is one bottle. During a shower Follow these steps when using CHG solution during a shower (unless your health care provider gives you different instructions): Start the shower. Use your normal soap and shampoo to wash your face and hair. Turn off the shower or move out of the shower stream. Pour the CHG onto a clean washcloth. Do not use any type of brush or rough-edged sponge. Starting at your neck, lather your body down to your toes. Make sure you follow these instructions: If you will be having surgery, pay special attention to the part of your body where you will be having surgery. Scrub this area for at least 1 minute. Do not use CHG on your head or face. If the solution gets into your ears or eyes, rinse them well with water. Avoid your genital area. Avoid any areas of skin that have broken skin, cuts, or scrapes. Scrub your back and under your arms. Make sure to wash skin folds. Let the lather sit on your skin for 1-2 minutes or as long as told by your health care provider. Thoroughly rinse your entire body in the shower. Make sure that all body creases and crevices are rinsed well. Dry off with a clean towel. Do not put any substances on your body afterward--such as powder, lotion, or perfume--unless you are told to do so by your health care provider. Only use lotions that are recommended by the manufacturer. Put on clean clothes or pajamas. If it is the night before your surgery, sleep in clean sheets.  During a sponge bath Follow these steps when using CHG solution during a sponge bath (unless your health care provider gives you different instructions): Use your normal soap and shampoo to wash your face and hair. Pour the CHG onto a clean  washcloth. Starting at your  neck, lather your body down to your toes. Make sure you follow these instructions: If you will be having surgery, pay special attention to the part of your body where you will be having surgery. Scrub this area for at least 1 minute. Do not use CHG on your head or face. If the solution gets into your ears or eyes, rinse them well with water. Avoid your genital area. Avoid any areas of skin that have broken skin, cuts, or scrapes. Scrub your back and under your arms. Make sure to wash skin folds. Let the lather sit on your skin for 1-2 minutes or as long as told by your health care provider. Using a different clean, wet washcloth, thoroughly rinse your entire body. Make sure that all body creases and crevices are rinsed well. Dry off with a clean towel. Do not put any substances on your body afterward--such as powder, lotion, or perfume--unless you are told to do so by your health care provider. Only use lotions that are recommended by the manufacturer. Put on clean clothes or pajamas. If it is the night before your surgery, sleep in clean sheets. How to use CHG prepackaged cloths Only use CHG cloths as told by your health care provider, and follow the instructions on the label. Use the CHG cloth on clean, dry skin. Do not use the CHG cloth on your head or face unless your health care provider tells you to. When washing with the CHG cloth: Avoid your genital area. Avoid any areas of skin that have broken skin, cuts, or scrapes. Before surgery Follow these steps when using a CHG cloth to clean before surgery (unless your health care provider gives you different instructions): Using the CHG cloth, vigorously scrub the part of your body where you will be having surgery. Scrub using a back-and-forth motion for 3 minutes. The area on your body should be completely wet with CHG when you are done scrubbing. Do not rinse. Discard the cloth and let the area air-dry. Do not put  any substances on the area afterward, such as powder, lotion, or perfume. Put on clean clothes or pajamas. If it is the night before your surgery, sleep in clean sheets.  For general bathing Follow these steps when using CHG cloths for general bathing (unless your health care provider gives you different instructions). Use a separate CHG cloth for each area of your body. Make sure you wash between any folds of skin and between your fingers and toes. Wash your body in the following order, switching to a new cloth after each step: The front of your neck, shoulders, and chest. Both of your arms, under your arms, and your hands. Your stomach and groin area, avoiding the genitals. Your right leg and foot. Your left leg and foot. The back of your neck, your back, and your buttocks. Do not rinse. Discard the cloth and let the area air-dry. Do not put any substances on your body afterward--such as powder, lotion, or perfume--unless you are told to do so by your health care provider. Only use lotions that are recommended by the manufacturer. Put on clean clothes or pajamas. Contact a health care provider if: Your skin gets irritated after scrubbing. You have questions about using your solution or cloth. You swallow any chlorhexidine. Call your local poison control center [phone])095-1920 in the [place] Get help right away if: Your eyes itch badly, or they become very red or swollen. Your skin itches badly and is red or swollen. Your hearing  changes. You have trouble seeing. You have swelling or tingling in your mouth or throat. You have trouble breathing. These symptoms may represent a serious problem that is an emergency. Do not wait to see if the symptoms will go away. Get medical help right away. Call your local emergency services (911 in the [place] Do not drive yourself to the hospital. Summary Chlorhexidine gluconate (CHG) is a germ-killing (antiseptic) solution that is used to clean the skin.  Cleaning your skin with CHG may help to lower your risk for infection. You may be given CHG to use for bathing. It may be in a bottle or in a prepackaged cloth to use on your skin. Carefully follow your health care provider's instructions and the instructions on the product label. Do not use CHG if you have a chlorhexidine allergy. Contact your health care provider if your skin gets irritated after scrubbing. This information is not intended to replace advice given to you by your health care provider. Make sure you discuss any questions you have with your health care provider. Document Revised: 03/12/2022 Document Reviewed: 01/23/2021 Elsevier Patient Education  2023 [employer].

## 2023-10-29 ENCOUNTER — Encounter (HOSPITAL_COMMUNITY): Payer: Self-pay

## 2023-10-29 ENCOUNTER — Encounter (HOSPITAL_COMMUNITY)
Admission: RE | Admit: 2023-10-29 | Discharge: 2023-10-29 | Disposition: A | Discharge status: Home / Self Care | Payer: BC Managed Care – PPO | Source: Ambulatory Visit | Attending: General Surgery

## 2023-10-29 VITALS — BP 122/89 | HR 78 | Temp 97.7°F | Resp 18 | Ht 68.0 in | Wt 192.0 lb

## 2023-10-29 DIAGNOSIS — K219 Gastro-esophageal reflux disease without esophagitis: Secondary | ICD-10-CM | POA: Diagnosis not present

## 2023-10-29 DIAGNOSIS — G4730 Sleep apnea, unspecified: Secondary | ICD-10-CM | POA: Diagnosis not present

## 2023-10-29 DIAGNOSIS — F17210 Nicotine dependence, cigarettes, uncomplicated: Secondary | ICD-10-CM | POA: Insufficient documentation

## 2023-10-29 DIAGNOSIS — F17200 Nicotine dependence, unspecified, uncomplicated: Secondary | ICD-10-CM

## 2023-10-29 DIAGNOSIS — K811 Chronic cholecystitis: Secondary | ICD-10-CM | POA: Diagnosis not present

## 2023-10-29 DIAGNOSIS — I10 Essential (primary) hypertension: Secondary | ICD-10-CM | POA: Diagnosis not present

## 2023-10-29 DIAGNOSIS — F17290 Nicotine dependence, other tobacco product, uncomplicated: Secondary | ICD-10-CM | POA: Diagnosis not present

## 2023-10-29 HISTORY — DX: Sleep apnea, unspecified: G47.30

## 2023-10-29 HISTORY — DX: Other complications of anesthesia, initial encounter: T88.59XA

## 2023-11-01 ENCOUNTER — Other Ambulatory Visit: Payer: Self-pay

## 2023-11-01 ENCOUNTER — Ambulatory Visit (HOSPITAL_COMMUNITY): Payer: BC Managed Care – PPO | Admitting: Anesthesiology

## 2023-11-01 ENCOUNTER — Encounter (HOSPITAL_COMMUNITY)
Admission: RE | Disposition: A | Discharge status: Home / Self Care | Payer: Self-pay | Source: Home / Self Care | Attending: General Surgery

## 2023-11-01 ENCOUNTER — Encounter (HOSPITAL_COMMUNITY): Payer: Self-pay | Admitting: General Surgery

## 2023-11-01 ENCOUNTER — Ambulatory Visit (HOSPITAL_COMMUNITY)
Admission: RE | Admit: 2023-11-01 | Discharge: 2023-11-01 | Disposition: A | Discharge status: Home / Self Care | Payer: BC Managed Care – PPO | Attending: General Surgery | Admitting: General Surgery

## 2023-11-01 DIAGNOSIS — K811 Chronic cholecystitis: Secondary | ICD-10-CM | POA: Diagnosis present

## 2023-11-01 DIAGNOSIS — K219 Gastro-esophageal reflux disease without esophagitis: Secondary | ICD-10-CM | POA: Insufficient documentation

## 2023-11-01 DIAGNOSIS — G4730 Sleep apnea, unspecified: Secondary | ICD-10-CM | POA: Insufficient documentation

## 2023-11-01 DIAGNOSIS — I10 Essential (primary) hypertension: Secondary | ICD-10-CM | POA: Insufficient documentation

## 2023-11-01 DIAGNOSIS — F17290 Nicotine dependence, other tobacco product, uncomplicated: Secondary | ICD-10-CM | POA: Insufficient documentation

## 2023-11-01 SURGERY — CHOLECYSTECTOMY, ROBOT-ASSISTED, LAPAROSCOPIC
Anesthesia: General | Site: Abdomen

## 2023-11-01 MED ORDER — FENTANYL CITRATE (PF) 100 MCG/2ML IJ SOLN
INTRAMUSCULAR | Status: AC
  Filled 2023-11-01: qty 2

## 2023-11-01 MED ORDER — PROPOFOL 10 MG/ML IV BOLUS
INTRAVENOUS | Status: AC
  Filled 2023-11-01: qty 20

## 2023-11-01 MED ORDER — BUPIVACAINE HCL (PF) 0.5 % IJ SOLN
INTRAMUSCULAR | Status: DC | PRN
  Administered 2023-11-01: 30 mL

## 2023-11-01 MED ORDER — OXYCODONE HCL 5 MG/5ML PO SOLN: 5.0000 mg | Freq: Once | ORAL | Status: AC | PRN

## 2023-11-01 MED ORDER — PROPOFOL 10 MG/ML IV BOLUS
INTRAVENOUS | Status: DC | PRN
  Administered 2023-11-01: 250 mg via INTRAVENOUS
  Administered 2023-11-01: 40 mg via INTRAVENOUS

## 2023-11-01 MED ORDER — ONDANSETRON HCL 4 MG/2ML IJ SOLN: 4.0000 mg | Freq: Once | INTRAMUSCULAR | Status: DC | PRN

## 2023-11-01 MED ORDER — FENTANYL CITRATE PF 50 MCG/ML IJ SOSY
25.0000 ug | PREFILLED_SYRINGE | INTRAMUSCULAR | Status: DC | PRN
  Administered 2023-11-01 (×4): 50 ug via INTRAVENOUS
  Filled 2023-11-01 (×4): qty 1

## 2023-11-01 MED ORDER — INDOCYANINE GREEN 25 MG IV SOLR: 2.5000 mg | Freq: Once | INTRAVENOUS | Status: AC

## 2023-11-01 MED ORDER — CHLORHEXIDINE GLUCONATE 0.12 % MT SOLN
15.0000 mL | Freq: Once | OROMUCOSAL | Status: AC
  Administered 2023-11-01: 15 mL via OROMUCOSAL

## 2023-11-01 MED ORDER — LACTATED RINGERS IV SOLN: INTRAVENOUS | Status: DC

## 2023-11-01 MED ORDER — ONDANSETRON HCL 4 MG/2ML IJ SOLN
INTRAMUSCULAR | Status: AC
  Filled 2023-11-01: qty 2

## 2023-11-01 MED ORDER — PROPOFOL 10 MG/ML IV BOLUS
INTRAVENOUS | Status: AC
Start: 2023-11-01 — End: ?
  Filled 2023-11-01: qty 20

## 2023-11-01 MED ORDER — ONDANSETRON HCL 4 MG PO TABS: 4.0000 mg | ORAL_TABLET | Freq: Three times a day (TID) | ORAL | 1 refills | Status: AC | PRN

## 2023-11-01 MED ORDER — ROCURONIUM BROMIDE 10 MG/ML (PF) SYRINGE
PREFILLED_SYRINGE | INTRAVENOUS | Status: AC
Start: 2023-11-01 — End: ?
  Filled 2023-11-01: qty 10

## 2023-11-01 MED ORDER — DEXAMETHASONE SODIUM PHOSPHATE 10 MG/ML IJ SOLN
INTRAMUSCULAR | Status: DC | PRN
  Administered 2023-11-01: 8 mg via INTRAVENOUS

## 2023-11-01 MED ORDER — FENTANYL CITRATE (PF) 100 MCG/2ML IJ SOLN
INTRAMUSCULAR | Status: DC | PRN
  Administered 2023-11-01: 50 ug via INTRAVENOUS
  Administered 2023-11-01: 100 ug via INTRAVENOUS
  Administered 2023-11-01: 50 ug via INTRAVENOUS

## 2023-11-01 MED ORDER — MIDAZOLAM HCL 2 MG/2ML IJ SOLN
INTRAMUSCULAR | Status: AC
  Filled 2023-11-01: qty 2

## 2023-11-01 MED ORDER — ROCURONIUM BROMIDE 10 MG/ML (PF) SYRINGE
PREFILLED_SYRINGE | INTRAVENOUS | Status: DC | PRN
  Administered 2023-11-01: 70 mg via INTRAVENOUS
  Administered 2023-11-01: 50 mg via INTRAVENOUS

## 2023-11-01 MED ORDER — HYDROMORPHONE HCL 1 MG/ML IJ SOLN
INTRAMUSCULAR | Status: DC | PRN
  Administered 2023-11-01 (×2): .5 mg via INTRAVENOUS

## 2023-11-01 MED ORDER — ORAL CARE MOUTH RINSE: 15.0000 mL | Freq: Once | OROMUCOSAL | Status: AC

## 2023-11-01 MED ORDER — DEXMEDETOMIDINE HCL IN NACL 80 MCG/20ML IV SOLN
INTRAVENOUS | Status: DC | PRN
  Administered 2023-11-01: 20 ug via INTRAVENOUS

## 2023-11-01 MED ORDER — LIDOCAINE HCL (CARDIAC) PF 100 MG/5ML IV SOSY
PREFILLED_SYRINGE | INTRAVENOUS | Status: DC | PRN
  Administered 2023-11-01: 100 mg via INTRAVENOUS

## 2023-11-01 MED ORDER — OXYCODONE HCL 5 MG PO TABS: 5.0000 mg | ORAL_TABLET | ORAL | 0 refills | Status: AC | PRN

## 2023-11-01 MED ORDER — SODIUM CHLORIDE 0.9 % IV SOLN
2.0000 g | INTRAVENOUS | Status: AC
  Administered 2023-11-01: 2 g via INTRAVENOUS
  Filled 2023-11-01: qty 2

## 2023-11-01 MED ORDER — CHLORHEXIDINE GLUCONATE CLOTH 2 % EX PADS: 6.0000 | MEDICATED_PAD | Freq: Once | CUTANEOUS | Status: DC

## 2023-11-01 MED ORDER — OXYCODONE HCL 5 MG PO TABS
5.0000 mg | ORAL_TABLET | Freq: Once | ORAL | Status: AC | PRN
  Administered 2023-11-01: 5 mg via ORAL
  Filled 2023-11-01: qty 1

## 2023-11-01 MED ORDER — SUGAMMADEX SODIUM 200 MG/2ML IV SOLN
INTRAVENOUS | Status: DC | PRN
  Administered 2023-11-01: 200 mg via INTRAVENOUS

## 2023-11-01 MED ORDER — DEXAMETHASONE SODIUM PHOSPHATE 10 MG/ML IJ SOLN
INTRAMUSCULAR | Status: AC
  Filled 2023-11-01: qty 1

## 2023-11-01 MED ORDER — HYDROMORPHONE HCL 1 MG/ML IJ SOLN
INTRAMUSCULAR | Status: AC
  Filled 2023-11-01: qty 1

## 2023-11-01 MED ORDER — CHLORHEXIDINE GLUCONATE CLOTH 2 % EX PADS
6.0000 | MEDICATED_PAD | Freq: Once | CUTANEOUS | Status: AC
  Administered 2023-11-01: 6 via TOPICAL

## 2023-11-01 MED ORDER — FENTANYL CITRATE PF 50 MCG/ML IJ SOSY
50.0000 ug | PREFILLED_SYRINGE | Freq: Once | INTRAMUSCULAR | Status: AC
  Administered 2023-11-01: 50 ug via INTRAVENOUS

## 2023-11-01 MED ORDER — INDOCYANINE GREEN 25 MG IV SOLR
INTRAVENOUS | Status: AC
  Administered 2023-11-01: 2.5 mg via INTRAVENOUS
  Filled 2023-11-01: qty 10

## 2023-11-01 MED ORDER — BUPIVACAINE HCL (PF) 0.5 % IJ SOLN
INTRAMUSCULAR | Status: AC
  Filled 2023-11-01: qty 30

## 2023-11-01 MED ORDER — MIDAZOLAM HCL 2 MG/2ML IJ SOLN
INTRAMUSCULAR | Status: DC | PRN
  Administered 2023-11-01: 2 mg via INTRAVENOUS

## 2023-11-01 MED ORDER — STERILE WATER FOR IRRIGATION IR SOLN
Status: DC | PRN
  Administered 2023-11-01: 1000 mL

## 2023-11-01 MED ORDER — LIDOCAINE HCL (PF) 2 % IJ SOLN
INTRAMUSCULAR | Status: AC
Start: 2023-11-01 — End: ?
  Filled 2023-11-01: qty 5

## 2023-11-01 SURGICAL SUPPLY — 39 items
BLADE SURG 15 STRL LF DISP TIS (BLADE) ×1 IMPLANT
CAUTERY HOOK MNPLR 1.6 DVNC XI (INSTRUMENTS) ×1 IMPLANT
CHLORAPREP W/TINT 26 (MISCELLANEOUS) ×1 IMPLANT
CLIP LIGATING HEM O LOK PURPLE (MISCELLANEOUS) ×1 IMPLANT
COVER LIGHT HANDLE STERIS (MISCELLANEOUS) ×2 IMPLANT
DERMABOND ADVANCED .7 DNX12 (GAUZE/BANDAGES/DRESSINGS) ×1 IMPLANT
DRAPE ARM DVNC X/XI (DISPOSABLE) ×4 IMPLANT
DRAPE COLUMN DVNC XI (DISPOSABLE) ×1 IMPLANT
ELECT REM PT RETURN 9FT ADLT (ELECTROSURGICAL) ×1
ELECTRODE REM PT RTRN 9FT ADLT (ELECTROSURGICAL) ×1 IMPLANT
FORCEPS BPLR R/ABLATION 8 DVNC (INSTRUMENTS) ×1 IMPLANT
FORCEPS PROGRASP DVNC XI (FORCEP) ×1 IMPLANT
GLOVE BIO SURGEON STRL SZ 6.5 (GLOVE) ×2 IMPLANT
GLOVE BIOGEL PI IND STRL 6.5 (GLOVE) ×2 IMPLANT
GLOVE BIOGEL PI IND STRL 7.0 (GLOVE) ×2 IMPLANT
GLOVE BIOGEL PI IND STRL 8 (GLOVE) IMPLANT
GLOVE SURG SS PI 8.0 STRL IVOR (GLOVE) IMPLANT
GOWN STRL REUS W/TWL LRG LVL3 (GOWN DISPOSABLE) ×4 IMPLANT
GOWN STRL REUS W/TWL XL LVL3 (GOWN DISPOSABLE) IMPLANT
GRASPER SUT TROCAR 14GX15 (MISCELLANEOUS) IMPLANT
KIT TURNOVER KIT A (KITS) ×1 IMPLANT
MANIFOLD NEPTUNE II (INSTRUMENTS) IMPLANT
NDL HYPO 21X1.5 SAFETY (NEEDLE) ×1 IMPLANT
NEEDLE HYPO 21X1.5 SAFETY (NEEDLE) ×1
OBTURATOR OPTICAL STND 8 DVNC (TROCAR) ×1
OBTURATOR OPTICALSTD 8 DVNC (TROCAR) ×1 IMPLANT
PACK LAP CHOLE LZT030E (CUSTOM PROCEDURE TRAY) ×1 IMPLANT
PAD ARMBOARD 7.5X6 YLW CONV (MISCELLANEOUS) ×1 IMPLANT
PENCIL HANDSWITCHING (ELECTRODE) ×1 IMPLANT
POSITIONER HEAD 8X9X4 ADT (SOFTGOODS) ×1 IMPLANT
SEAL UNIV 5-12 XI (MISCELLANEOUS) ×4 IMPLANT
SET BASIN LINEN APH (SET/KITS/TRAYS/PACK) ×1 IMPLANT
SET TUBE DA VINCI INSUFFLATOR (TUBING) IMPLANT
SUT MNCRL AB 4-0 PS2 18 (SUTURE) ×2 IMPLANT
SUT VICRYL 0 AB UR-6 (SUTURE) IMPLANT
SYR 30ML LL (SYRINGE) ×1 IMPLANT
SYS RETRIEVAL 5MM INZII UNIV (BASKET) ×1
SYSTEM RETRIEVL 5MM INZII UNIV (BASKET) ×1 IMPLANT
WATER STERILE IRR 500ML POUR (IV SOLUTION) ×1 IMPLANT

## 2023-11-01 NOTE — Discharge Instructions (Addendum)
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  Band of fat scarred up to the right abdominal wall also could have contributed to some pain. This was fixed.   Common Complaints: Right shoulder pain is common after laparoscopic surgery.  This is secondary to the gas used in the surgery being trapped under the diaphragm.  Walk to help your body absorb the gas. This will improve in a few days. Pain at the port sites are common, especially the larger port sites. This will improve with time.  Some nausea is common and poor appetite. The main goal is to stay hydrated the first few days after surgery.   Diet/ Activity: Diet as tolerated. You may not have an appetite, but it is important to stay hydrated.  Drink 64 ounces of water a day. Your appetite will return with time.  Shower per your regular routine daily.  Do not take hot showers. Take warm showers that are less than 10 minutes. Rest and listen to your body, but do not remain in bed all day.  Walk everyday for at least 15-20 minutes. Deep cough and move around every 1-2 hours in the first few days after surgery.  Do not lift > 10 lbs, perform excessive bending, pushing, pulling, squatting for 1-2 weeks after surgery.  Do not pick at the dermabond glue on your incision sites.  This glue film will remain in place for 1-2 weeks and will start to peel off.  Do not place lotions or balms on your incision unless instructed to specifically by Dr. Henreitta Leber.   Pain Expectations and Narcotics: -After surgery you will have pain associated with your incisions and this is normal. The pain is muscular and nerve pain, and will get better with time. -You are encouraged and expected to take non narcotic medications like tylenol and ibuprofen (when able) to treat pain as multiple modalities can aid with pain treatment. -Narcotics are only used when pain is severe  or there is breakthrough pain. -You are not expected to have a pain score of 0 after surgery, as we cannot prevent pain. A pain score of 3-4 that allows you to be functional, move, walk, and tolerate some activity is the goal. The pain will continue to improve over the days after surgery and is dependent on your surgery. -Due to [place] law, we are only able to give a certain amount of pain medication to treat post operative pain, and we only give additional narcotics on a patient by patient basis.  -For most laparoscopic surgery, studies have shown that the majority of patients only need 10-15 narcotic pills, and for open surgeries most patients only need 15-20.   -Having appropriate expectations of pain and knowledge of pain management with non narcotics is important as we do not want anyone to become addicted to narcotic pain medication.  -Using ice packs in the first 48 hours and heating pads after 48 hours, wearing an abdominal binder (when recommended), and using over the counter medications are all ways to help with pain management.   -Simple acts like meditation and mindfulness practices after surgery can also help with pain control and research has proven the benefit of these practices.  Medication: Take tylenol and ibuprofen as needed for pain control, alternating every 4-6 hours.  Example:  Tylenol 1000mg  @ 6am, 12noon, 6pm, (Do not exceed 4000mg  of tylenol a day). Ibuprofen 800mg  @ 9am, 3pm,  9pm, 3am (Do not exceed 3600mg  of ibuprofen a day).  Take Roxicodone for breakthrough pain every 4 hours.  Take Colace for constipation related to narcotic pain medication. If you do not have a bowel movement in 2 days, take Miralax over the counter.  Drink plenty of water to also prevent constipation.   Contact Information: If you have questions or concerns, please call our office, [phone], Monday- Thursday 8AM-5PM and Friday 8AM-12Noon.  If it is after hours or on the weekend, please  call [facility], [phone], [phone], and ask to speak to the surgeon on call for Dr. Henreitta Leber at [facility].

## 2023-11-01 NOTE — Progress Notes (Signed)
Dr.Kiel notified patient in severe pain level 7 per pt. Patient having cramps and abdomen tight. New order given for one time dose of Fentanyl 50 mcg IV.  At 1028 this was given.  At 1039 patient states pain is eased up. Pain level 3-4 per pt.

## 2023-11-01 NOTE — H&P (Signed)
[facility] History and Physical     Jessy Batie Montez Hageman. is a 52 y.o. male.  HPI:Mr. Halbleib is a 52 yo how has been having right sided pain under his rib cage for about 4 years. He says that he can randomly have pain/ soreness in the region and feels like it spasms at times. He cannot say that this is related to  food. He has had [place] in the past that did not show stones or thickening but he did have a Murphy's sign which could represent chronic cholecystitis. He also had a HIDA that was normal. He has had EGD with GI and found to have gastritis. He feels like his worse symptoms happen when he is dehydrated or stressed. The pain is always localized under the right rib cage.   Past Medical History:  Diagnosis Date   Complication of anesthesia    combative and restless after anesthesia   GERD (gastroesophageal reflux disease)    Sleep apnea     Past Surgical History:  Procedure Laterality Date   BIOPSY  03/08/2023   Procedure: BIOPSY;  Surgeon: Lanelle Bal, DO;  Location: [facility];  Service: Endoscopy;;   COLONOSCOPY  2011   polpys removed per patient   COLONOSCOPY WITH PROPOFOL N/A 08/16/2022   Surgeon: Lanelle Bal, DO;  non-bleeding internal hemorrhoids, two 4-5 mm hyperplastic polyps removed. Recommended repeat in 10 years.   ESOPHAGOGASTRODUODENOSCOPY (EGD) WITH PROPOFOL N/A 03/08/2023   Dr. Marletta Lor; Gastritis biopsied, otherwise normal exam.  Gastric biopsies were benign.   HAND SURGERY Left    2001   POLYPECTOMY  08/16/2022   Procedure: POLYPECTOMY;  Surgeon: Lanelle Bal, DO;  Location: [facility];  Service: Endoscopy;;    History reviewed. No pertinent family history.  Social History   Tobacco Use   Smoking status: Every Day    Types: E-cigarettes    Passive exposure: Current   Smokeless tobacco: Former  Building services engineer status: Some Days   Substances: Nicotine  Substance Use Topics   Alcohol use: Yes    Alcohol/week:  3.0 standard drinks of alcohol    Types: 3 Cans of beer per week    Comment: socially    Drug use: Not Currently    Medications: I have reviewed the patient's current medications. Medications Prior to Admission  Medication Sig Dispense Refill Last Dose   Lactobacillus Rhamnosus, GG, (CULTURELLE HEALTH & WELLNESS PO) Take 1 capsule by mouth in the morning.   10/31/2023   levocetirizine (XYZAL) 5 MG tablet Take 5 mg by mouth in the morning.   10/31/2023   Omega-3 Fatty Acids (FISH OIL PO) Take 2,800 mg by mouth 3 (three) times a week.   10/31/2023   pantoprazole (PROTONIX) 40 MG tablet Take 1 tablet (40 mg total) by mouth daily before breakfast. 30 tablet 5 10/31/2023    Current Facility-Administered Medications  Medication Dose Route Frequency Provider Last Rate Last Admin   cefoTEtan (CEFOTAN) 2 g in sodium chloride 0.9 % 100 mL IVPB  2 g Intravenous On Call to OR Lucretia Roers, MD       Chlorhexidine Gluconate Cloth 2 % PADS 6 each  6 each Topical Once Lucretia Roers, MD       lactated ringers infusion   Intravenous Continuous Johnnette Litter, Mosetta Putt, MD        No Known Allergies    ROS:  A comprehensive review of systems was negative except for: Gastrointestinal:  positive for RUQ pain  Blood pressure (!) 131/91, pulse 69, temperature 98.3 F (36.8 C), temperature source Oral, resp. rate 20, height 5\' 8"  (1.727 m), weight 87.1 kg, SpO2 98%. Physical Exam Vitals reviewed.  HENT:     Mouth/Throat:     Mouth: Mucous membranes are moist.  Eyes:     Extraocular Movements: Extraocular movements intact.  Cardiovascular:     Rate and Rhythm: Normal rate.  Pulmonary:     Effort: Pulmonary effort is normal.  Abdominal:     Palpations: Abdomen is soft.  Musculoskeletal:        General: Normal range of motion.  Skin:    General: Skin is warm.  Neurological:     General: No focal deficit present.     Mental Status: He is alert.  Psychiatric:        Mood and Affect: Mood normal.         Behavior: Behavior normal.     Results: CLINICAL DATA:  RUQ pain   EXAM: ULTRASOUND ABDOMEN LIMITED RIGHT UPPER QUADRANT   COMPARISON:  None Available.   FINDINGS: Gallbladder:   No gallstones or wall thickening visualized. A subjective sonographic Eulah Pont sign is noted by sonographer. No pericholecystic fluid. Gallbladder is distended.   Common bile duct:   Diameter: Visualized portion measures 4 mm, within normal limits.   Liver:   No focal lesion identified. Diffusely increased parenchymal echogenicity with poor acoustic penetrance. Portal vein is patent on color Doppler imaging with normal direction of blood flow towards the liver.   Other: None.   IMPRESSION: 1. A positive subjective sonographic Eulah Pont sign is reported by the sonographer. No ancillary evidence of acute cholecystitis. This could reflect underlying biliary dyskinesia or degree of chronic cholecystitis. Consider further dedicated evaluation with HIDA scan for evaluation of gallbladder ejection fraction. 2. Hepatic steatosis.   These results will be called to the ordering clinician or representative by the Radiologist Assistant, and communication documented in the PACS or [employer].     Electronically Signed   By: Meda Klinefelter M.D.   On: 07/13/2022 15:39   CLINICAL DATA:  Right upper quadrant abdominal pain.   EXAM: NUCLEAR MEDICINE HEPATOBILIARY IMAGING WITH GALLBLADDER EF   TECHNIQUE: Sequential images of the abdomen were obtained out to 60 minutes following intravenous administration of radiopharmaceutical. After oral ingestion of Ensure, gallbladder ejection fraction was determined. At 60 min, normal ejection fraction is greater than 33%.   RADIOPHARMACEUTICALS:  5.4 mCi Tc-63m  Choletec IV   COMPARISON:  Ultrasound July 13, 2022   FINDINGS: Prompt uptake and biliary excretion of activity by the liver is seen. Gallbladder activity is visualized, consistent with patency  of cystic duct. Biliary activity passes into small bowel, consistent with patent common bile duct.   Calculated gallbladder ejection fraction is 79%. (Normal gallbladder ejection fraction with Ensure is greater than 33%.)   IMPRESSION: 1.  Patent cystic and common bile ducts.   2.  Normal gallbladder ejection fraction.     Electronically Signed   By: Maudry Mayhew M.D.   On: 08/07/2022 12:08    Assessment & Plan:  Theone Murdoch Wempe Montez Hageman. is a 52 y.o. male with chronic cholecystitis and this vague pain that was in the RUQ for over 4 years. He continues to get pain in this region. It is not always associated with food. He says that he is tired of worrying about this pain. He has had an extensive workup and has had gastritis in the  past.     Discussed the option of cholecystectomy and that this would at least get the gallbladder off the table for reasons for his pain. Discussed that some people do have this pain and that it can be vague.    PLAN: I counseled the patient about the indication, risks and benefits of robotic assisted laparoscopic cholecystectomy.  He understands there is a very small chance for bleeding, infection, injury to normal structures (including common bile duct), conversion to open surgery, persistent symptoms, evolution of postcholecystectomy diarrhea, need for secondary interventions, anesthesia reaction, cardiopulmonary issues and other risks not specifically detailed here. I described the expected recovery, the plan for follow-up and the restrictions during the recovery phase.  All questions were answered.     All questions were answered to the satisfaction of the patient.   Lucretia Roers 11/01/2023, 7:21 AM

## 2023-11-01 NOTE — Op Note (Addendum)
[facility] Operative Note  11/01/23  Preoperative Diagnosis: Chronic cholecystitis    Postoperative Diagnosis: Same   Procedure(s) Performed: Robotic Assisted Laparoscopic Cholecystectomy   Surgeon: Leatrice Jewels. Henreitta Leber, MD   Assistants: No qualified resident was available    Anesthesia: General endotracheal   Anesthesiologist: Windell Norfolk, MD    Specimens: Gallbladder   Estimated Blood Loss: Minimal   Blood Replacement: None    Complications: None   Wound Class: Clean contaminated   Operative Indications: The patient was found to have normal gallbladder on imaging but was symptomatic with biliary colic like symptoms. We discussed chronic cholecystitis and possibly this causing the pain.  We discussed the risk of the procedure including but not limited to bleeding, infection, injury to the common bile duct, bile leak, need for further procedures, chance of subtotal cholecystectomy.   Findings:  Omental adhesion in the right side taken down (? Could also have caused some pain?)  Critical view of safety noted All clips intact at the end of the case Adequate hemostasis   Procedure: Firefly was given in the preoperative area. The patient was taken to the operating room and placed supine. General endotracheal anesthesia was induced. Intravenous antibiotics were administered per protocol.  An orogastric tube positioned to decompress the stomach. The abdomen was prepared and draped in the usual sterile fashion.   Veress needle was placed at the supraumbilical area and insufflation was started after confirming a positive saline drop test and no immediate increase in abdominal pressure.  After reaching 15 mm, the Veress needle was removed and a 8 mm port was placed via optiview technique supraumbilical, measuring 20 mm away from the suspected position of the gallbladder.  The abdomen was inspected and no abnormalities or injuries were found.  Under direct vision,  ports were placed in the following locations in a semi curvilinear position around the target of the gallbladder: One 8 mm port placed on the patient's left 8 cm from the umbilical port. I had to use the left side port to take down an omental adhesion in the right abdominal wall with scissors before I could place the right sided ports. This was freed up from the peritoneal cavity. Then Two 8 mm ports on the patient's right each having 8cm clearance to the adjacent ports. Once ports were placed, the table was placed in the reverse Trendelenburg position with the right side up. The Xi platform was brought into the operative field and docked to the ports successfully.  An endoscope was placed through the umbilical port, prograsper through the most lateral right port, forced bipolar to the port just right of the umbilicus, and then a hook cautery in the left port.  The dome of the gallbladder was grasped with prograsp and retracted over the dome of the liver. Adhesions between the gallbladder and omentum, duodenum and transverse colon were lysed via hook cautery. The infundibulum was grasped with the forced bipolar and retracted toward the right lower quadrant. This maneuver exposed Calot's triangle. Firefly was used throughout the dissection to ensure safe visualization of the cystic duct.The peritoneum overlying the gallbladder infundibulum was then dissected and the cystic duct and cystic artery identified.  Critical view of safety with the liver bed clearly visible behind the duct and artery with no additional structures noted.  The cystic duct and cystic artery were doubly clipped and divided close to the gallbladder.    The gallbladder was then dissected from its peritoneal and liver bed attachments by  electrocautery. Hemostasis was checked prior to removing the hook cautery.  A 5mm Endo Catch bag was then placed through the left side port. The [employer] was undocked and moved out of the field,  and the gallbladder  was removed in the bag.  The gallbladder was passed off the table as a specimen. There was no evidence of bleeding from the gallbladder fossa or cystic artery or leakage of the bile from the cystic duct stump. The left port site closed with a 0 vicryl and PMI due to dilation from removing the gallbladder. ) vicryl used to  close the supraumbilical fascia as it was visualized from the skin. The abdomen was desufflated and secondary trocars were removed under direct vision.   No bleeding was noted. All skin incisions were closed with subcuticular sutures of 4-0 monocryl and dermabond.   Final inspection revealed acceptable hemostasis. All counts were correct at the end of the case. The patient was awakened from anesthesia and extubated without complication. The OG tube was removed.  The patient went to the PACU in stable condition.   Algis Greenhouse, MD [facility] [address] [place], [place] [address] [phone] (office)

## 2023-11-01 NOTE — Anesthesia Preprocedure Evaluation (Signed)
Anesthesia Evaluation  Patient identified by MRN, date of birth, ID band Patient awake    Reviewed: Allergy & Precautions, H&P , NPO status , Patient's Chart, lab work & pertinent test results, reviewed documented beta blocker date and time   History of Anesthesia Complications (+) history of anesthetic complications  Airway Mallampati: II  TM Distance: >3 FB Neck ROM: full    Dental no notable dental hx.    Pulmonary neg pulmonary ROS, sleep apnea , Current Smoker   Pulmonary exam normal breath sounds clear to auscultation       Cardiovascular Exercise Tolerance: Good hypertension, negative cardio ROS  Rhythm:regular Rate:Normal     Neuro/Psych negative neurological ROS  negative psych ROS   GI/Hepatic negative GI ROS, Neg liver ROS,GERD  ,,  Endo/Other  negative endocrine ROS    Renal/GU negative Renal ROS  negative genitourinary   Musculoskeletal   Abdominal   Peds  Hematology negative hematology ROS (+)   Anesthesia Other Findings   Reproductive/Obstetrics negative OB ROS                             Anesthesia Physical Anesthesia Plan  ASA: 2  Anesthesia Plan: General and General ETT   Post-op Pain Management:    Induction:   PONV Risk Score and Plan: Ondansetron  Airway Management Planned:   Additional Equipment:   Intra-op Plan:   Post-operative Plan:   Informed Consent: I have reviewed the patients History and Physical, chart, labs and discussed the procedure including the risks, benefits and alternatives for the proposed anesthesia with the patient or authorized representative who has indicated his/her understanding and acceptance.     Dental Advisory Given  Plan Discussed with: CRNA  Anesthesia Plan Comments:        Anesthesia Quick Evaluation

## 2023-11-01 NOTE — Transfer of Care (Signed)
Immediate Anesthesia Transfer of Care Note  Patient: [employer].  Procedure(s) Performed: XI ROBOTIC ASSISTED LAPAROSCOPIC CHOLECYSTECTOMY (Abdomen)  Patient Location: PACU  Anesthesia Type:General  Level of Consciousness: drowsy and patient cooperative  Airway & Oxygen Therapy: Patient Spontanous Breathing and Patient connected to face mask oxygen  Post-op Assessment: Report given to RN and Post -op Vital signs reviewed and stable  Post vital signs: Reviewed and stable  Last Vitals:  Vitals Value Taken Time  BP 111/78 11/01/23 0900  Temp 36.4 C 11/01/23 0857  Pulse 78 11/01/23 0906  Resp 13 11/01/23 0906  SpO2 100 % 11/01/23 0906  Vitals shown include unfiled device data.  Last Pain:  Vitals:   11/01/23 0643  TempSrc: Oral  PainSc: 0-No pain      Patients Stated Pain Goal: 5 (11/01/23 1610)  Complications: No notable events documented.

## 2023-11-01 NOTE — Anesthesia Postprocedure Evaluation (Signed)
Anesthesia Post Note  Patient: [employer].  Procedure(s) Performed: XI ROBOTIC ASSISTED LAPAROSCOPIC CHOLECYSTECTOMY (Abdomen)  Patient location during evaluation: Phase II Anesthesia Type: General Level of consciousness: awake Pain management: pain level controlled Vital Signs Assessment: post-procedure vital signs reviewed and stable Respiratory status: spontaneous breathing and respiratory function stable Cardiovascular status: blood pressure returned to baseline and stable Postop Assessment: no headache and no apparent nausea or vomiting Anesthetic complications: no Comments: Late entry   No notable events documented.   Last Vitals:  Vitals:   11/01/23 1007 11/01/23 1053  BP: 124/73 129/80  Pulse: 68 78  Resp: 16 16  Temp: 36.6 C   SpO2: 99% 99%    Last Pain:  Vitals:   11/01/23 1053  TempSrc:   PainSc: 4                  Windell Norfolk

## 2023-11-01 NOTE — Progress Notes (Signed)
[facility]  Updated family. Works as Architect all over the country, told family for him to take it easy for next two weeks.   Algis Greenhouse, MD [facility] [address] [place] [place], [place] [address] [phone] (office)

## 2023-11-01 NOTE — Progress Notes (Signed)
Patient up walking in hallway. No distress noted.

## 2023-11-01 NOTE — Anesthesia Procedure Notes (Signed)
Procedure Name: Intubation Date/Time: 11/01/2023 7:36 AM  Performed by: Oletha Cruel, CRNAPre-anesthesia Checklist: Patient identified, Emergency Drugs available, Suction available and Patient being monitored Patient Re-evaluated:Patient Re-evaluated prior to induction Oxygen Delivery Method: Circle system utilized Preoxygenation: Pre-oxygenation with 100% oxygen Induction Type: IV induction Ventilation: Mask ventilation without difficulty Laryngoscope Size: Mac and 4 Grade View: Grade III Tube type: Oral Tube size: 7.5 mm Number of attempts: 1 Airway Equipment and Method: Stylet Placement Confirmation: ETT inserted through vocal cords under direct vision, positive ETCO2, CO2 detector and breath sounds checked- equal and bilateral Secured at: 23 cm Tube secured with: Tape Dental Injury: Teeth and Oropharynx as per pre-operative assessment  Comments: Atraumatic intubation, lips and teeth remain in preoperative condition.

## 2023-11-04 LAB — SURGICAL PATHOLOGY

## 2023-11-14 ENCOUNTER — Ambulatory Visit (INDEPENDENT_AMBULATORY_CARE_PROVIDER_SITE_OTHER): Payer: BC Managed Care – PPO | Admitting: General Surgery

## 2023-11-14 DIAGNOSIS — K811 Chronic cholecystitis: Secondary | ICD-10-CM

## 2023-11-14 NOTE — Progress Notes (Signed)
[facility]  I am calling the patient for post operative evaluation. This is not a billable encounter as it is under the global charges for the surgery.  The patient had a robotic assisted cholecystectomy on 12/6. The patient reports that he is doing  pretty good. The are tolerating a diet, having good pain control but is sore at times, and having regular Bms.  The incisions are healing. The patient has no concerns major concerns except for his soreness which is mainly at this supraumbilical incision. I closed this and the left sided port site with 0 vicryl and this is likely the cause of the pain. Will improve after 4-6 weeks post op.   Pathology: A. GALLBLADDER, CHOLECYSTECTOMY:  Chronic cholecystitis.   Will see the patient PRN.  Diet and activity as tolerated.  Algis Greenhouse, MD [facility] [address] [place], [place] [address] [phone] (office)

## 2023-12-02 ENCOUNTER — Encounter: Payer: Self-pay | Admitting: Gastroenterology

## 2024-01-06 ENCOUNTER — Other Ambulatory Visit: Payer: Self-pay | Admitting: Gastroenterology

## 2024-01-06 DIAGNOSIS — K219 Gastro-esophageal reflux disease without esophagitis: Secondary | ICD-10-CM

## 2024-01-10 ENCOUNTER — Encounter: Payer: Self-pay | Admitting: *Deleted

## 2024-01-10 ENCOUNTER — Ambulatory Visit
Admission: EM | Admit: 2024-01-10 | Discharge: 2024-01-10 | Disposition: A | Discharge status: Home / Self Care | Payer: BC Managed Care – PPO | Attending: Nurse Practitioner | Admitting: Nurse Practitioner

## 2024-01-10 DIAGNOSIS — S97101A Crushing injury of unspecified right toe(s), initial encounter: Secondary | ICD-10-CM | POA: Diagnosis not present

## 2024-01-10 DIAGNOSIS — L03031 Cellulitis of right toe: Secondary | ICD-10-CM | POA: Diagnosis not present

## 2024-01-10 MED ORDER — DOXYCYCLINE HYCLATE 100 MG PO TABS: 100.0000 mg | ORAL_TABLET | Freq: Two times a day (BID) | ORAL | 0 refills | Status: AC

## 2024-01-10 MED ORDER — CHLORHEXIDINE GLUCONATE 4 % EX SOLN: Freq: Every day | CUTANEOUS | 0 refills | Status: AC | PRN

## 2024-01-10 NOTE — ED Provider Notes (Signed)
[facility]    CSN: [id number] Arrival date & time: 01/10/24  1614      History   Chief Complaint Chief Complaint  Patient presents with   Foot Injury    HPI Darryl Turner. is a 53 y.o. male.   The history is provided by the patient.   Patient presents after an injury to the right great toe that occurred approximately 11 days ago.  Patient states he was moving a table, when he dropped a table on the toe.  He states that the toenail was lifted from the nail bed, patient performed trepanation at home, and states that the symptoms improved.  He states over the past several days, he has had increased pain, redness, swelling, and foul smelling drainage from the right great toe.  States that he has been taking Tylenol and soaking the foot in warm Epsom salt soaks.  Patient declines an x-ray today.  States he is more concerned about possible infection.  Past Medical History:  Diagnosis Date   Complication of anesthesia    combative and restless after anesthesia   GERD (gastroesophageal reflux disease)    Sleep apnea     Patient Active Problem List   Diagnosis Date Noted   Chronic cholecystitis 09/17/2023   SKIN TAG, HEMORRHOIDAL, RESIDUAL 03/13/2010   GERD 03/13/2010   RECTAL BLEEDING 03/13/2010   Dysphagia 03/13/2010   EPIGASTRIC PAIN 03/13/2010    Past Surgical History:  Procedure Laterality Date   BIOPSY  03/08/2023   Procedure: BIOPSY;  Surgeon: Lanelle Bal, DO;  Location: [facility];  Service: Endoscopy;;   CHOLECYSTECTOMY     COLONOSCOPY  2011   polpys removed per patient   COLONOSCOPY WITH PROPOFOL N/A 08/16/2022   Surgeon: Lanelle Bal, DO;  non-bleeding internal hemorrhoids, two 4-5 mm hyperplastic polyps removed. Recommended repeat in 10 years.   ESOPHAGOGASTRODUODENOSCOPY (EGD) WITH PROPOFOL N/A 03/08/2023   Dr. Marletta Lor; Gastritis biopsied, otherwise normal exam.  Gastric biopsies were benign.   HAND SURGERY Left    2001    POLYPECTOMY  08/16/2022   Procedure: POLYPECTOMY;  Surgeon: Lanelle Bal, DO;  Location: [facility];  Service: Endoscopy;;       Home Medications    Prior to Admission medications   Medication Sig Start Date End Date Taking? Authorizing Provider  chlorhexidine (HIBICLENS) 4 % external liquid Apply topically daily as needed. Mix a small amount of the solution in warm water and clean the affected area twice daily until symptoms improve. 01/10/24  Yes Leath-Warren, Sadie Haber, NP  doxycycline (VIBRA-TABS) 100 MG tablet Take 1 tablet (100 mg total) by mouth 2 (two) times daily for 7 days. 01/10/24 01/17/24 Yes Leath-Warren, Sadie Haber, NP  pantoprazole (PROTONIX) 40 MG tablet TAKE 1 TABLET BY MOUTH ONCE DAILY BEFORE BREAKFAST 01/06/24  Yes Gelene Mink, NP  Lactobacillus Rhamnosus, GG, (CULTURELLE HEALTH & WELLNESS PO) Take 1 capsule by mouth in the morning.    [provider]  levocetirizine (XYZAL) 5 MG tablet Take 5 mg by mouth in the morning.    [provider]  Omega-3 Fatty Acids (FISH OIL PO) Take 2,800 mg by mouth 3 (three) times a week.    [provider]  ondansetron (ZOFRAN) 4 MG tablet Take 1 tablet (4 mg total) by mouth every 8 (eight) hours as needed. 11/01/23 10/31/24  Lucretia Roers, MD  oxyCODONE (ROXICODONE) 5 MG immediate release tablet Take 1 tablet (5 mg total) by mouth  every 4 (four) hours as needed for severe pain (pain score 7-10) or breakthrough pain. 11/01/23 10/31/24  Lucretia Roers, MD    Family History History reviewed. No pertinent family history.  Social History Social History   Tobacco Use   Smoking status: Every Day    Types: E-cigarettes    Passive exposure: Current   Smokeless tobacco: Former  Building services engineer status: Some Days   Substances: Nicotine  Substance Use Topics   Alcohol use: Yes    Alcohol/week: 3.0 standard drinks of alcohol    Types: 3 Cans of beer per week    Comment: socially    Drug use: Not  Currently     Allergies   Patient has no known allergies.   Review of Systems Review of Systems Per HPI  Physical Exam Triage Vital Signs ED Triage Vitals  Encounter Vitals Group     BP 01/10/24 1718 131/78     Systolic BP Percentile --      Diastolic BP Percentile --      Pulse Rate 01/10/24 1718 90     Resp 01/10/24 1718 16     Temp 01/10/24 1718 98 F (36.7 C)     Temp Source 01/10/24 1718 Oral     SpO2 01/10/24 1718 95 %     Weight --      Height --      Head Circumference --      Peak Flow --      Pain Score 01/10/24 1717 3     Pain Loc --      Pain Education --      Exclude from Growth Chart --    No data found.  Updated Vital Signs BP 131/78 (BP Location: Right Arm)   Pulse 90   Temp 98 F (36.7 C) (Oral)   Resp 16   SpO2 95%   Visual Acuity Right Eye Distance:   Left Eye Distance:   Bilateral Distance:    Right Eye Near:   Left Eye Near:    Bilateral Near:     Physical Exam Vitals and nursing note reviewed.  Constitutional:      General: He is not in acute distress.    Appearance: Normal appearance.  HENT:     Head: Normocephalic.  Eyes:     Extraocular Movements: Extraocular movements intact.     Pupils: Pupils are equal, round, and reactive to light.  Pulmonary:     Effort: Pulmonary effort is normal.  Musculoskeletal:     Cervical back: Normal range of motion.     Right foot: Decreased range of motion (Right great toe). No deformity.  Feet:     Right foot:     Skin integrity: Erythema and warmth present.     Comments: Erythema and swelling to the right great toe. Right great toenail loose, no drainage present.  Decreased range of motion present.  Toenail of right great toe is white.  Separation noted at the lateral and medial cuticle edges. Skin:    General: Skin is warm and dry.  Neurological:     General: No focal deficit present.     Mental Status: He is alert and oriented to person, place, and time.  Psychiatric:        Mood  and Affect: Mood normal.        Behavior: Behavior normal.      UC Treatments / Results  Labs (all labs ordered are listed, but only  abnormal results are displayed) Labs Reviewed - No data to display  EKG   Radiology No results found.  Procedures Procedures (including critical care time)  Medications Ordered in UC Medications - No data to display  Initial Impression / Assessment and Plan / UC Course  I have reviewed the triage vital signs and the nursing notes.  Pertinent labs & imaging results that were available during my care of the patient were reviewed by me and considered in my medical decision making (see chart for details).  Patient with injury to the right great toe that occurred approximately 11 days ago.  Patient declines imaging, states he is more concerned about possible infection.  Patient with redness and swelling of the right great toe, nail of the right great toe is completely white, will most likely fall off on its own.  Will start patient on doxycycline 100 mg twice daily for the next 7 days along with Hibiclens 4% solution.  Supportive care recommendations were provided and discussed with the patient to include over-the-counter analgesics, ice, and to monitor for signs of infection.  Patient was given strict ER follow-up precautions.  Patient was in agreement with this plan of care and verbalized understanding.  All questions were answered.  Patient stable for discharge.   Final Clinical Impressions(s) / UC Diagnoses   Final diagnoses:  Cellulitis of toe, right  Crush injury, toe, right, initial encounter     Discharge Instructions      Take medication as prescribed. May take OTC Tylenol or Ibuprofen as needed for pain or discomfort. May soak the foot in warm Epsom salt soak to help with pain and swelling. Monitor for signs of worsening to include increased drainage, swelling, redness that goes up the right leg or if entire foot turns red or begins to  swell. If any of these symptoms present, please go to the emergency department immediately. Wear comfortable fitting shoes.  Recommend xray, but you have declined today. Recommend following up with your PCP for reevaluation within 7 to 10 days.  Follow-up as needed.     ED Prescriptions     Medication Sig Dispense Auth. Provider   doxycycline (VIBRA-TABS) 100 MG tablet Take 1 tablet (100 mg total) by mouth 2 (two) times daily for 7 days. 14 tablet Leath-Warren, Sadie Haber, NP   chlorhexidine (HIBICLENS) 4 % external liquid Apply topically daily as needed. Mix a small amount of the solution in warm water and clean the affected area twice daily until symptoms improve. 118 mL Leath-Warren, Sadie Haber, NP      PDMP not reviewed this encounter.   Abran Cantor, NP 01/10/24 1740

## 2024-01-10 NOTE — Discharge Instructions (Signed)
Take medication as prescribed. May take OTC Tylenol or Ibuprofen as needed for pain or discomfort. May soak the foot in warm Epsom salt soak to help with pain and swelling. Monitor for signs of worsening to include increased drainage, swelling, redness that goes up the right leg or if entire foot turns red or begins to swell. If any of these symptoms present, please go to the emergency department immediately. Wear comfortable fitting shoes.  Recommend xray, but you have declined today. Recommend following up with your PCP for reevaluation within 7 to 10 days.  Follow-up as needed.

## 2024-01-10 NOTE — ED Triage Notes (Signed)
Pt states he was moving a conference table top and dropped it on his right great toe on 12/30/2023. He states the toenail is almost floating on his toe. He states pain ans swelling, now there is some drainage.  He is taking tylenol

## 2024-02-04 ENCOUNTER — Other Ambulatory Visit: Payer: Self-pay | Admitting: Gastroenterology

## 2024-02-04 DIAGNOSIS — K219 Gastro-esophageal reflux disease without esophagitis: Secondary | ICD-10-CM

## 2024-08-04 ENCOUNTER — Other Ambulatory Visit: Payer: Self-pay | Admitting: Gastroenterology

## 2024-08-04 DIAGNOSIS — K219 Gastro-esophageal reflux disease without esophagitis: Secondary | ICD-10-CM

## 2024-10-12 DIAGNOSIS — Z0000 Encounter for general adult medical examination without abnormal findings: Secondary | ICD-10-CM | POA: Diagnosis not present

## 2024-10-12 DIAGNOSIS — G4733 Obstructive sleep apnea (adult) (pediatric): Secondary | ICD-10-CM | POA: Diagnosis not present

## 2024-10-12 DIAGNOSIS — Z125 Encounter for screening for malignant neoplasm of prostate: Secondary | ICD-10-CM | POA: Diagnosis not present

## 2024-10-12 DIAGNOSIS — K219 Gastro-esophageal reflux disease without esophagitis: Secondary | ICD-10-CM | POA: Diagnosis not present

## 2024-10-12 DIAGNOSIS — E785 Hyperlipidemia, unspecified: Secondary | ICD-10-CM | POA: Diagnosis not present

## 2024-10-30 ENCOUNTER — Other Ambulatory Visit: Payer: Self-pay | Admitting: Gastroenterology

## 2024-10-30 DIAGNOSIS — K219 Gastro-esophageal reflux disease without esophagitis: Secondary | ICD-10-CM
